# Patient Record
Sex: Female | Born: 1980 | Race: Black or African American | Hispanic: No | Marital: Married | Smoking: Never smoker
Health system: Southern US, Community
[De-identification: ages and names within clinical notes are randomized; demographics above are authoritative.]

## PROBLEM LIST (undated history)

## (undated) ENCOUNTER — Inpatient Hospital Stay (HOSPITAL_COMMUNITY): Payer: Self-pay

## (undated) HISTORY — PX: WISDOM TOOTH EXTRACTION: SHX21

---

## 2012-10-22 ENCOUNTER — Encounter (HOSPITAL_COMMUNITY): Payer: Self-pay | Admitting: Emergency Medicine

## 2012-10-22 ENCOUNTER — Emergency Department (INDEPENDENT_AMBULATORY_CARE_PROVIDER_SITE_OTHER)
Admission: EM | Admit: 2012-10-22 | Discharge: 2012-10-22 | Disposition: A | Discharge status: Home / Self Care | Payer: Self-pay | Source: Home / Self Care | Attending: Family Medicine | Admitting: Family Medicine

## 2012-10-22 DIAGNOSIS — Z3201 Encounter for pregnancy test, result positive: Secondary | ICD-10-CM

## 2012-10-22 LAB — POCT PREGNANCY, URINE: Preg Test, Ur: POSITIVE — AB

## 2012-10-22 MED ORDER — PRENATAL COMPLETE 14-0.4 MG PO TABS: ORAL_TABLET | ORAL | Status: DC

## 2012-10-22 NOTE — ED Provider Notes (Signed)
Medical screening examination/treatment/procedure(s) were performed by resident physician or non-physician practitioner and as supervising physician I was immediately available for consultation/collaboration.   Laurence Crofford DOUGLAS MD.   Chanelle Hodsdon D Joanann Mies, MD 10/22/12 1432 

## 2012-10-22 NOTE — ED Notes (Signed)
Pt requesting pregnancy testing. Pt voices no concerns.

## 2012-10-22 NOTE — ED Provider Notes (Signed)
CSN: [id number]     Arrival date & time 10/22/12  1045 History   First MD Initiated Contact with Patient 10/22/12 1137     Chief Complaint  Patient presents with  . Possible Pregnancy    request pregnancy test.    (Consider location/radiation/quality/duration/timing/severity/associated sxs/prior Treatment) HPI Comments: 32 year old female presents requesting a pregnancy test. She is late on her period. Last menstrual period began on August 9. She is usually very irregular. Additionally, she has been having breast soreness and some mild lower back pain which is unusual for her. She is in a steady relationship. The baby was not planned but they are not trying to not have a baby either. Denies abdominal pain, fever, chills, vaginal bleeding, vaginal discharge.  Patient is a 32 y.o. female presenting with pregnancy problem.  Possible Pregnancy Patient reports no abdominal pain.  Associated symptoms include no dysuria, no fever, no light-headedness, no nausea, no shortness of breath, no vomiting and no weakness.    History reviewed. No pertinent past medical history. History reviewed. No pertinent past surgical history. History reviewed. No pertinent family history. History  Substance Use Topics  . Smoking status: Never Smoker   . Smokeless tobacco: Not on file  . Alcohol Use: Yes   OB History   Grav Para Term Preterm Abortions TAB SAB Ect Mult Living                 Review of Systems  Constitutional: Negative for fever and chills.  Eyes: Negative for visual disturbance.  Respiratory: Negative for cough and shortness of breath.   Cardiovascular: Negative for chest pain, palpitations and leg swelling.  Gastrointestinal: Negative for nausea, vomiting and abdominal pain.  Endocrine: Negative for polydipsia and polyuria.  Genitourinary: Negative for dysuria, urgency and frequency.       Breast soreness  Musculoskeletal: Positive for back pain. Negative for myalgias and arthralgias.   Skin: Negative for rash.  Neurological: Negative for dizziness, weakness and light-headedness.    Allergies  Review of patient's allergies indicates no known allergies.  Home Medications   Current Outpatient Rx  Name  Route  Sig  Dispense  Refill  . Prenatal Vit-Fe Fumarate-FA (PRENATAL COMPLETE) 14-0.4 MG TABS      Use as directed on package   60 each   0    BP 138/93  Pulse 78  Resp 14  SpO2 100%  LMP 09/18/2012 Physical Exam  Nursing note and vitals reviewed. Constitutional: She is oriented to person, place, and time. She appears well-developed and well-nourished. No distress.  HENT:  Head: Normocephalic and atraumatic.  Eyes: Pupils are equal, round, and reactive to light.  Pulmonary/Chest: Effort normal. No respiratory distress.  Abdominal: Soft. She exhibits no mass. There is no tenderness. There is no rebound and no guarding.  Neurological: She is alert and oriented to person, place, and time. Coordination normal.  Skin: Skin is warm and dry. No rash noted. She is not diaphoretic.  Psychiatric: She has a normal mood and affect. Judgment normal.    ED Course  Procedures (including critical care time) Labs Review Labs Reviewed  POCT PREGNANCY, URINE - Abnormal; Notable for the following:    Preg Test, Ur POSITIVE (*)    All other components within normal limits  HCG, QUANTITATIVE, PREGNANCY   Imaging Review No results found.  MDM   1. Positive urine pregnancy test    Urine pregnancy test positive. Sending hCG Quant, [facility]  She should start taking a prenatal vitamin at this time as well.     Graylon Good, PA-C 10/22/12 1216

## 2012-10-23 NOTE — ED Notes (Signed)
BHCG: 2762 H.  Sent to [employer] [facility].

## 2013-06-25 ENCOUNTER — Inpatient Hospital Stay (HOSPITAL_COMMUNITY)
Admission: AD | Admit: 2013-06-25 | Discharge: 2013-06-25 | Disposition: A | Discharge status: Home / Self Care | Payer: BC Managed Care – PPO | Source: Ambulatory Visit | Attending: Obstetrics and Gynecology | Admitting: Obstetrics and Gynecology

## 2013-06-25 ENCOUNTER — Encounter (HOSPITAL_COMMUNITY): Payer: Self-pay

## 2013-06-25 ENCOUNTER — Encounter (HOSPITAL_COMMUNITY): Payer: Self-pay | Admitting: *Deleted

## 2013-06-25 ENCOUNTER — Inpatient Hospital Stay (HOSPITAL_COMMUNITY)
Admission: AD | Admit: 2013-06-25 | Discharge: 2013-06-27 | DRG: 776 | Disposition: A | Discharge status: Home / Self Care | Payer: BC Managed Care – PPO | Source: Ambulatory Visit | Attending: Obstetrics and Gynecology | Admitting: Obstetrics and Gynecology

## 2013-06-25 ENCOUNTER — Inpatient Hospital Stay (HOSPITAL_COMMUNITY): Payer: BC Managed Care – PPO

## 2013-06-25 DIAGNOSIS — O479 False labor, unspecified: Secondary | ICD-10-CM | POA: Insufficient documentation

## 2013-06-25 MED ORDER — ZOLPIDEM TARTRATE 5 MG PO TABS
5.0000 mg | ORAL_TABLET | Freq: Every evening | ORAL | Status: DC | PRN
Start: 2013-06-25 — End: 2013-06-27

## 2013-06-25 MED ORDER — ONDANSETRON HCL 4 MG PO TABS: 4.0000 mg | ORAL_TABLET | ORAL | Status: DC | PRN

## 2013-06-25 MED ORDER — METHYLERGONOVINE MALEATE 0.2 MG/ML IJ SOLN: 0.2000 mg | INTRAMUSCULAR | Status: DC | PRN

## 2013-06-25 MED ORDER — DIBUCAINE 1 % RE OINT: 1.0000 "application " | TOPICAL_OINTMENT | RECTAL | Status: DC | PRN

## 2013-06-25 MED ORDER — IBUPROFEN 600 MG PO TABS
600.0000 mg | ORAL_TABLET | Freq: Four times a day (QID) | ORAL | Status: DC
  Administered 2013-06-25 – 2013-06-27 (×7): 600 mg via ORAL
  Filled 2013-06-25 (×7): qty 1

## 2013-06-25 MED ORDER — SIMETHICONE 80 MG PO CHEW
80.0000 mg | CHEWABLE_TABLET | ORAL | Status: DC | PRN
Start: 2013-06-25 — End: 2013-06-27

## 2013-06-25 MED ORDER — TETANUS-DIPHTH-ACELL PERTUSSIS 5-2.5-18.5 LF-MCG/0.5 IM SUSP: 0.5000 mL | Freq: Once | INTRAMUSCULAR | Status: DC

## 2013-06-25 MED ORDER — WITCH HAZEL-GLYCERIN EX PADS: 1.0000 "application " | MEDICATED_PAD | CUTANEOUS | Status: DC | PRN

## 2013-06-25 MED ORDER — DIPHENHYDRAMINE HCL 25 MG PO CAPS: 25.0000 mg | ORAL_CAPSULE | Freq: Four times a day (QID) | ORAL | Status: DC | PRN

## 2013-06-25 MED ORDER — MAGNESIUM HYDROXIDE 400 MG/5ML PO SUSP: 30.0000 mL | ORAL | Status: DC | PRN

## 2013-06-25 MED ORDER — LANOLIN HYDROUS EX OINT: TOPICAL_OINTMENT | CUTANEOUS | Status: DC | PRN

## 2013-06-25 MED ORDER — METHYLERGONOVINE MALEATE 0.2 MG PO TABS: 0.2000 mg | ORAL_TABLET | ORAL | Status: DC | PRN

## 2013-06-25 MED ORDER — ONDANSETRON HCL 4 MG/2ML IJ SOLN: 4.0000 mg | INTRAMUSCULAR | Status: DC | PRN

## 2013-06-25 MED ORDER — PRENATAL MULTIVITAMIN CH
1.0000 | ORAL_TABLET | Freq: Every day | ORAL | Status: DC
  Administered 2013-06-26: 1 via ORAL
  Filled 2013-06-25: qty 1

## 2013-06-25 MED ORDER — OXYCODONE-ACETAMINOPHEN 5-325 MG PO TABS: 1.0000 | ORAL_TABLET | ORAL | Status: DC | PRN

## 2013-06-25 MED ORDER — ERYTHROMYCIN 5 MG/GM OP OINT
TOPICAL_OINTMENT | Freq: Once | OPHTHALMIC | Status: DC
  Filled 2013-06-25: qty 1

## 2013-06-25 MED ORDER — MEASLES, MUMPS & RUBELLA VAC ~~LOC~~ INJ
0.5000 mL | INJECTION | Freq: Once | SUBCUTANEOUS | Status: AC
  Administered 2013-06-27: 0.5 mL via SUBCUTANEOUS
  Filled 2013-06-25: qty 0.5

## 2013-06-25 MED ORDER — BENZOCAINE-MENTHOL 20-0.5 % EX AERO: 1.0000 "application " | INHALATION_SPRAY | CUTANEOUS | Status: DC | PRN

## 2013-06-25 MED ORDER — SENNOSIDES-DOCUSATE SODIUM 8.6-50 MG PO TABS
2.0000 | ORAL_TABLET | ORAL | Status: DC
  Administered 2013-06-25 – 2013-06-26 (×2): 2 via ORAL
  Filled 2013-06-25 (×2): qty 2

## 2013-06-25 NOTE — MAU Note (Addendum)
PT  SAYS SHE STARTED HURTING   BAD AT 10PM.   VE IN OFFICE  THIS WEEK- 1 CM.    DENIES HSV AND MRSA. GBS- NEG

## 2013-06-25 NOTE — MAU Note (Signed)
Called mother baby nurse to give report. Nurse preferred bedside report and said to bring pt in 15 minutes.

## 2013-06-25 NOTE — H&P (Signed)
Crystal Sharp is a 33 y.o. female, G2 P0010, EGA [redacted] weeks with EDC 5-16 presenting for regular ctx.  She was seen earlier this morning to r/o labor, was having ctx q 5-7 min, VE 1 cm and did not change.  FHT was not reactive initially, BPP 8/8 and then FHT reactive.  She was sent home around 0600.  She continued to have ctx, they gradually got closer and stronger but she did not want to come back and get sent home again.  She started having some bleeding and increasing pressure so she came in.  When she arrived in the parking lot, she was pushing the baby out.  Dad delivered the baby in the car, [facility] CNM delivered the placenta in [facility].  Mom and baby doing well.  Prenatal care essentially uncomplicated, see prenatal records for complete history.  Maternal Medical History:  Reason for admission: Contractions.   Contractions: Frequency: regular.   Perceived severity is strong.    Fetal activity: Perceived fetal activity is normal.    Prenatal complications: no prenatal complications   OB History   Grav Para Term Preterm Abortions TAB SAB Ect Mult Living   2    1 1          No past medical history on file. Past Surgical History  Procedure Laterality Date  . Wisdom tooth extraction     Family History: family history is not on file. Social History:  reports that she has never smoked. She does not have any smokeless tobacco history on file. She reports that she drinks alcohol. She reports that she does not use illicit drugs.   Prenatal Transfer Tool  Maternal Diabetes: No Genetic Screening: Normal Maternal Ultrasounds/Referrals: Normal Fetal Ultrasounds or other Referrals:  None Maternal Substance Abuse:  No Significant Maternal Medications:  None Significant Maternal Lab Results:  Lab values include: Group B Strep negative Other Comments:  Delivered in the car  Review of Systems  Respiratory: Negative.   Cardiovascular: Negative.       Blood pressure 141/77, pulse 97,  temperature 98.3 F (36.8 C), last menstrual period 09/18/2012. Exam Physical Exam  Constitutional: She appears well-developed and well-nourished.  Cardiovascular: Normal rate, regular rhythm and normal heart sounds.   No murmur heard. Respiratory: Effort normal and breath sounds normal. No respiratory distress. She has no wheezes.  GI: Soft.  Fundus firm at U-0  Genitourinary:  Perineum with left labial laceration-hemostatic and not repaired, also small right labial abrasion Bleeding appears normal    Prenatal labs: ABO, Rh:  O pos Antibody:  neg Rubella:  Immune RPR:   NR HBsAg:  Neg  HIV:  Neg  GBS:   Neg GCT:  79  Assessment/Plan: IUP at 40 weeks with precipitous delivery in the car on arrival to MAU.  CNM delivered placenta, I am unsure of EBL, but it does not appear to be excessive.  Routine postpartum care.   Abdulrahman Bracey 06/25/2013, 2:23 PM

## 2013-06-26 LAB — CBC
HCT: 30.2 % — ABNORMAL LOW (ref 36.0–46.0)
Hemoglobin: 9.9 g/dL — ABNORMAL LOW (ref 12.0–15.0)
MCH: 28.4 pg (ref 26.0–34.0)
MCHC: 32.8 g/dL (ref 30.0–36.0)
MCV: 86.5 fL (ref 78.0–100.0)
PLATELETS: 197 10*3/uL (ref 150–400)
RBC: 3.49 MIL/uL — AB (ref 3.87–5.11)
RDW: 13.8 % (ref 11.5–15.5)
WBC: 9.4 10*3/uL (ref 4.0–10.5)

## 2013-06-26 LAB — RPR

## 2013-06-26 LAB — ABO/RH: ABO/RH(D): O POS

## 2013-06-26 NOTE — Lactation Note (Signed)
This note was copied from the chart of Crystal Sharp. Lactation Consultation Note: Initial visit with mom. She reports that baby has been nursing well. A little pain at the beginning of the feeding but then eases off. Baby asleep in bassinet at present and mom eating lunch. BF brochure given with resources for support after DC. No questions at present. To call prn  Patient Name: Crystal Timmi Laural BenesJohnson Today's Date: 06/26/2013 Reason for consult: Initial assessment   Maternal Data Formula Feeding for Exclusion: No Does the patient have breastfeeding experience prior to this delivery?: No  Feeding    LATCH Score/Interventions                      Lactation Tools Discussed/Used WIC Program: Yes   Consult Status Consult Status: Follow-up Date: 06/27/13 Follow-up type: In-patient    Pamelia HoitDonna D Grant Swager 06/26/2013, 1:46 PM

## 2013-06-27 MED ORDER — IBUPROFEN 600 MG PO TABS: 600.0000 mg | ORAL_TABLET | Freq: Four times a day (QID) | ORAL | Status: DC

## 2013-06-27 NOTE — Lactation Note (Signed)
This note was copied from the chart of Crystal Pari Johnson. Lactation Consultation Note: Mom ready for DC Reports that baby just fed about 20 minutes ago. Reports that nipples are getting sore ,but intact. Comfort gels given with instructions for use and cleaning. No questions at present. Reviewed BFSG and OP appointments as resources for support after DC. To call prn  Patient Name: Crystal Sharp Today's Date: 06/27/2013 Reason for consult: Follow-up assessment   Maternal Data Formula Feeding for Exclusion: No  Feeding    LATCH Score/Interventions          Comfort (Breast/Nipple): Filling, red/small blisters or bruises, mild/mod discomfort  Problem noted: Mild/Moderate discomfort Interventions (Mild/moderate discomfort): Comfort gels        Lactation Tools Discussed/Used Tools: Comfort gels   Consult Status Consult Status: Complete    Pamelia HoitDonna D Ersa Delaney 06/27/2013, 12:12 PM

## 2013-06-27 NOTE — Discharge Instructions (Signed)
As per discharge pamphlet °

## 2013-06-27 NOTE — Progress Notes (Signed)
PPD #2 I saw her yesterday but neglected to write a note.  She was not having any problems. Today, doing well Afeb, VSS Fundus Firm at U-1 D/c home

## 2013-06-27 NOTE — Discharge Summary (Signed)
Obstetric Discharge Summary Reason for Admission: onset of labor Prenatal Procedures: none Intrapartum Procedures: spontaneous vaginal delivery Postpartum Procedures: none Complications-Operative and Postpartum: left labial laceration Hemoglobin  Date Value Ref Range Status  06/26/2013 9.9* 12.0 - 15.0 g/dL Final     HCT  Date Value Ref Range Status  06/26/2013 30.2* 36.0 - 46.0 % Final    Physical Exam:  General: alert Lochia: appropriate Uterine Fundus: firm  Discharge Diagnoses: Term Pregnancy-delivered-precipitous delivery in the parking lot  Discharge Information: Date: 06/27/2013 Activity: pelvic rest Diet: routine Medications: Ibuprofen Condition: stable Instructions: refer to practice specific booklet Discharge to: home Follow-up Information   Follow up with Ramey Schiff D, MD. Schedule an appointment as soon as possible for a visit in 6 weeks.   Specialty:  Obstetrics and Gynecology   Contact information:   [address], [address] [place] [address] [phone]       Newborn Data: Live born female  Birth Weight: 7 lb 10.8 oz (3480 g) APGAR: 9, 9  Home with mother.  Loy Mccartt 06/27/2013, 8:08 AM

## 2013-06-29 ENCOUNTER — Inpatient Hospital Stay (HOSPITAL_COMMUNITY): Admission: RE | Admit: 2013-06-29 | Payer: BC Managed Care – PPO | Source: Ambulatory Visit

## 2013-12-12 ENCOUNTER — Encounter (HOSPITAL_COMMUNITY): Payer: Self-pay

## 2014-08-30 ENCOUNTER — Inpatient Hospital Stay (HOSPITAL_COMMUNITY)
Admission: AD | Admit: 2014-08-30 | Discharge: 2014-08-31 | Disposition: A | Discharge status: Home / Self Care | Payer: BLUE CROSS/BLUE SHIELD | Source: Ambulatory Visit | Attending: Obstetrics & Gynecology | Admitting: Obstetrics & Gynecology

## 2014-08-30 ENCOUNTER — Inpatient Hospital Stay (HOSPITAL_COMMUNITY): Payer: BLUE CROSS/BLUE SHIELD

## 2014-08-30 ENCOUNTER — Encounter (HOSPITAL_COMMUNITY): Payer: Self-pay | Admitting: *Deleted

## 2014-08-30 DIAGNOSIS — O209 Hemorrhage in early pregnancy, unspecified: Secondary | ICD-10-CM | POA: Diagnosis present

## 2014-08-30 DIAGNOSIS — O200 Threatened abortion: Secondary | ICD-10-CM | POA: Insufficient documentation

## 2014-08-30 DIAGNOSIS — Z3A01 Less than 8 weeks gestation of pregnancy: Secondary | ICD-10-CM | POA: Diagnosis not present

## 2014-08-30 LAB — URINE MICROSCOPIC-ADD ON

## 2014-08-30 LAB — CBC WITH DIFFERENTIAL/PLATELET
Basophils Absolute: 0 10*3/uL (ref 0.0–0.1)
Basophils Relative: 0 % (ref 0–1)
Eosinophils Absolute: 0.1 10*3/uL (ref 0.0–0.7)
Eosinophils Relative: 1 % (ref 0–5)
HCT: 36.9 % (ref 36.0–46.0)
Hemoglobin: 12.4 g/dL (ref 12.0–15.0)
Lymphocytes Relative: 31 % (ref 12–46)
Lymphs Abs: 2.9 10*3/uL (ref 0.7–4.0)
MCH: 29.7 pg (ref 26.0–34.0)
MCHC: 33.6 g/dL (ref 30.0–36.0)
MCV: 88.5 fL (ref 78.0–100.0)
MONO ABS: 0.5 10*3/uL (ref 0.1–1.0)
MONOS PCT: 6 % (ref 3–12)
NEUTROS ABS: 5.8 10*3/uL (ref 1.7–7.7)
NEUTROS PCT: 62 % (ref 43–77)
PLATELETS: 265 10*3/uL (ref 150–400)
RBC: 4.17 MIL/uL (ref 3.87–5.11)
RDW: 12.7 % (ref 11.5–15.5)
WBC: 9.3 10*3/uL (ref 4.0–10.5)

## 2014-08-30 LAB — POCT PREGNANCY, URINE: Preg Test, Ur: POSITIVE — AB

## 2014-08-30 LAB — URINALYSIS, ROUTINE W REFLEX MICROSCOPIC
Bilirubin Urine: NEGATIVE
Glucose, UA: NEGATIVE mg/dL
Ketones, ur: NEGATIVE mg/dL
LEUKOCYTES UA: NEGATIVE
NITRITE: NEGATIVE
PROTEIN: NEGATIVE mg/dL
SPECIFIC GRAVITY, URINE: 1.02 (ref 1.005–1.030)
Urobilinogen, UA: 0.2 mg/dL (ref 0.0–1.0)
pH: 5.5 (ref 5.0–8.0)

## 2014-08-30 LAB — HCG, QUANTITATIVE, PREGNANCY: HCG, BETA CHAIN, QUANT, S: 14796 m[IU]/mL — AB (ref <5)

## 2014-08-30 NOTE — MAU Note (Signed)
Pt started bleeding heavy at 8pm. slight abd pain. nauseas, no vomiting.

## 2014-08-30 NOTE — MAU Provider Note (Signed)
History     CSN: [id number]  Arrival date and time: 08/30/14 2026   First Provider Initiated Contact with Patient 08/30/14 2234      No chief complaint on file.  HPI Crystal Sharp 34 y.o. [id number] with positive pregnancy test at home a week ago presents with vaginal bleeding x 1 day and mild abdominal pain.  The bleeding was similar to heavy period for a short time and was dark red blood with clots but no tissue noted.  Denies abdominal pain, dysuria, fever, weakness, SOB.  Was able to eat well earlier today.   OB History    Gravida Para Term Preterm AB TAB SAB Ectopic Multiple Living   History reviewed. No pertinent past medical history.  Past Surgical History  Procedure Laterality Date  . Wisdom tooth extraction      History reviewed. No pertinent family history.  History  Substance Use Topics  . Smoking status: Never Smoker   . Smokeless tobacco: Not on file  . Alcohol Use: Yes     Comment:  NONE WHILE PREG    Allergies: No Known Allergies  Prescriptions prior to admission  Medication Sig Dispense Refill Last Dose  . Prenatal Vit-Fe Fumarate-FA (PRENATAL COMPLETE) 14-0.4 MG TABS Use as directed on package (Patient taking differently: Take 2 tablets by mouth at bedtime. ) 60 each 0 08/29/2014 at Unknown time  . [DISCONTINUED] ibuprofen (ADVIL,MOTRIN) 600 MG tablet Take 1 tablet (600 mg total) by mouth every 6 (six) hours. (Patient not taking: Reported on 08/30/2014) 30 tablet 0 Not Taking    ROS Pertinent ROS in HPI.  All other systems are negative.   Physical Exam   Blood pressure 146/84, pulse 84, temperature 99 F (37.2 C), resp. rate 16, height  (1.753 m), weight 242 lb 9.6 oz (110.043 kg), last menstrual period 07/16/2014, unknown if currently breastfeeding.  Physical Exam  Constitutional: She is oriented to person, place, and time. She appears well-developed and well-nourished. No distress.  HENT:  Head: Atraumatic.  Eyes: EOM  are normal.  Neck: Normal range of motion.  Cardiovascular: Normal rate, regular rhythm and normal heart sounds.   Respiratory: Effort normal and breath sounds normal. No respiratory distress.  GI: Soft. Bowel sounds are normal. She exhibits no distension. There is no tenderness. There is no rebound and no guarding.  Genitourinary:  Mod amt of dark red blood / small clots noted in vault.  No active bleeding Cervix closed.  No tenderness  Musculoskeletal: Normal range of motion.  Neurological: She is alert and oriented to person, place, and time.  Skin: Skin is warm and dry.  Psychiatric: She has a normal mood and affect.    MAU Course  Procedures  MDM Ectopic workup ordered to eval as pt has vag bleeding in early pregnancy and anatomic location is yet to be proven Results for orders placed or performed during the hospital encounter of 08/30/14 (from the past 24 hour(s))  Urinalysis, Routine w reflex microscopic (not at [facility])     Status: Abnormal   Collection Time: 08/30/14  8:50 PM  Result Value Ref Range   Color, Urine RED (A) YELLOW   APPearance CLOUDY (A) CLEAR   Specific Gravity, Urine 1.020 1.005 - 1.030   pH 5.5 5.0 - 8.0   Glucose, UA NEGATIVE NEGATIVE mg/dL   Hgb urine dipstick LARGE (A) NEGATIVE   Bilirubin Urine NEGATIVE NEGATIVE  Ketones, ur NEGATIVE NEGATIVE mg/dL   Protein, ur NEGATIVE NEGATIVE mg/dL   Urobilinogen, UA 0.2 0.0 - 1.0 mg/dL   Nitrite NEGATIVE NEGATIVE   Leukocytes, UA NEGATIVE NEGATIVE  Urine microscopic-add on     Status: None   Collection Time: 08/30/14  8:50 PM  Result Value Ref Range   Squamous Epithelial / LPF RARE RARE   WBC, UA 0-2 <3 WBC/hpf   RBC / HPF TOO NUMEROUS TO COUNT <3 RBC/hpf   Bacteria, UA RARE RARE  Pregnancy, urine POC     Status: Abnormal   Collection Time: 08/30/14  9:57 PM  Result Value Ref Range   Preg Test, Ur POSITIVE (A) NEGATIVE  hCG, quantitative, pregnancy     Status: Abnormal   Collection Time: 08/30/14 10:49  PM  Result Value Ref Range   hCG, Beta Chain, Quant, S 14796 (H) <5 mIU/mL  CBC with Differential/Platelet     Status: None   Collection Time: 08/30/14 10:49 PM  Result Value Ref Range   WBC 9.3 4.0 - 10.5 K/uL   RBC 4.17 3.87 - 5.11 MIL/uL   Hemoglobin 12.4 12.0 - 15.0 g/dL   HCT 65.7 84.6 - 96.2 %   MCV 88.5 78.0 - 100.0 fL   MCH 29.7 26.0 - 34.0 pg   MCHC 33.6 30.0 - 36.0 g/dL   RDW 95.2 84.1 - 32.4 %   Platelets 265 150 - 400 K/uL   Neutrophils Relative % 62 43 - 77 %   Neutro Abs 5.8 1.7 - 7.7 K/uL   Lymphocytes Relative 31 12 - 46 %   Lymphs Abs 2.9 0.7 - 4.0 K/uL   Monocytes Relative 6 3 - 12 %   Monocytes Absolute 0.5 0.1 - 1.0 K/uL   Eosinophils Relative 1 0 - 5 %   Eosinophils Absolute 0.1 0.0 - 0.7 K/uL   Basophils Relative 0 0 - 1 %   Basophils Absolute 0.0 0.0 - 0.1 K/uL   Fax: [phone]   US Ob Comp Less 14 Wks  08/30/2014   CLINICAL DATA:  Acute onset of vaginal bleeding.  Initial encounter.  EXAM: OBSTETRIC <14 WK [place] AND TRANSVAGINAL OB US  TECHNIQUE: Both transabdominal and transvaginal ultrasound examinations were performed for complete evaluation of the gestation as well as the maternal uterus, adnexal regions, and pelvic cul-de-sac. Transvaginal technique was performed to assess early pregnancy.  COMPARISON:  Pelvic ultrasound performed 06/25/2013  FINDINGS: Intrauterine gestational sac: Visualized/normal in shape.  Yolk sac:  Yes  Embryo:  Yes  Cardiac Activity: Yes  Heart Rate: 82  bpm  CRL:  2.6  mm   5 w   6 d                  [place] EDC: 04/26/2015  Maternal uterus/adnexae: No subchorionic hemorrhage is noted. The uterus is otherwise unremarkable.  The ovaries are within normal limits. The right ovary measures 3.0 x 1.2 x 1.9 cm, while the left ovary measures 3.4 x 1.6 x 2.2 cm. No suspicious adnexal masses are seen; there is no evidence for ovarian torsion.  No free fluid is seen within the pelvic cul-de-sac.  IMPRESSION: Single live intrauterine pregnancy noted,  with a crown-rump length of 3 mm, corresponding to a gestational age of [redacted] weeks 6 days. The heart rate remains within normal limits for the size of the embryo. This matches the gestational age of [redacted] weeks 3 days by LMP, reflecting an estimated date of delivery of April 22, 2015.   Electronically Signed   By: Roanna RaiderJeffery  Chang M.D.   On: 08/30/2014 23:24   [place] Ob Transvaginal  08/30/2014   CLINICAL DATA:  Acute onset of vaginal bleeding.  Initial encounter.  EXAM: OBSTETRIC <14 WK US AND TRANSVAGINAL OB US  TECHNIQUE: Both transabdominal and transvaginal ultrasound examinations were performed for complete evaluation of the gestation as well as the maternal uterus, adnexal regions, and pelvic cul-de-sac. Transvaginal technique was performed to assess early pregnancy.  COMPARISON:  Pelvic ultrasound performed 06/25/2013  FINDINGS: Intrauterine gestational sac: Visualized/normal in shape.  Yolk sac:  Yes  Embryo:  Yes  Cardiac Activity: Yes  Heart Rate: 82  bpm  CRL:  2.6  mm   5 w   6 d                  US EDC: 04/26/2015  Maternal uterus/adnexae: No subchorionic hemorrhage is noted. The uterus is otherwise unremarkable.  The ovaries are within normal limits. The right ovary measures 3.0 x 1.2 x 1.9 cm, while the left ovary measures 3.4 x 1.6 x 2.2 cm. No suspicious adnexal masses are seen; there is no evidence for ovarian torsion.  No free fluid is seen within the pelvic cul-de-sac.  IMPRESSION: Single live intrauterine pregnancy noted, with a crown-rump length of 3 mm, corresponding to a gestational age of [redacted] weeks 6 days. The heart rate remains within normal limits for the size of the embryo. This matches the gestational age of [redacted] weeks 3 days by LMP, reflecting an estimated date of delivery of April 22, 2015.   Electronically Signed   By: Roanna RaiderJeffery  Chang M.D.   On: 08/30/2014 23:24   IUP confirmed on U/S.  Hemodynamically stable.  No evidence of infection  Assessment and Plan  A:  1. Threatened miscarriage in  early pregnancy   2. Vaginal bleeding in pregnancy, first trimester    P: Discharge to home Pelvic rest [facility] Patient may return to [facility] as needed or if her condition were to change or worsen   Bertram Denvereague Clark, Karen E 08/30/2014, 10:34 PM

## 2014-08-31 DIAGNOSIS — O200 Threatened abortion: Secondary | ICD-10-CM

## 2014-08-31 NOTE — Discharge Instructions (Signed)
Vaginal Bleeding During Pregnancy, First Trimester °A small amount of bleeding (spotting) from the vagina is relatively common in early pregnancy. It usually stops on its own. Various things may cause bleeding or spotting in early pregnancy. Some bleeding may be related to the pregnancy, and some may not. In most cases, the bleeding is normal and is not a problem. However, bleeding can also be a sign of something serious. Be sure to tell your health care provider about any vaginal bleeding right away. °Some possible causes of vaginal bleeding during the first trimester include: °· Infection or inflammation of the cervix. °· Growths (polyps) on the cervix. °· Miscarriage or threatened miscarriage. °· Pregnancy tissue has developed outside of the uterus and in a fallopian tube (tubal pregnancy). °· Tiny cysts have developed in the uterus instead of pregnancy tissue (molar pregnancy). °HOME CARE INSTRUCTIONS  °Watch your condition for any changes. The following actions may help to lessen any discomfort you are feeling: °· Follow your health care provider's instructions for limiting your activity. If your health care provider orders bed rest, you may need to stay in bed and only get up to use the bathroom. However, your health care provider may allow you to continue light activity. °· If needed, make plans for someone to help with your regular activities and responsibilities while you are on bed rest. °· Keep track of the number of pads you use each day, how often you change pads, and how soaked (saturated) they are. Write this down. °· Do not use tampons. Do not douche. °· Do not have sexual intercourse or orgasms until approved by your health care provider. °· If you pass any tissue from your vagina, save the tissue so you can show it to your health care provider. °· Only take over-the-counter or prescription medicines as directed by your health care provider. °· Do not take aspirin because it can make you  bleed. °· Keep all follow-up appointments as directed by your health care provider. °SEEK MEDICAL CARE IF: °· You have any vaginal bleeding during any part of your pregnancy. °· You have cramps or labor pains. °· You have a fever, not controlled by medicine. °SEEK IMMEDIATE MEDICAL CARE IF:  °· You have severe cramps in your back or belly (abdomen). °· You pass large clots or tissue from your vagina. °· Your bleeding increases. °· You feel light-headed or weak, or you have fainting episodes. °· You have chills. °· You are leaking fluid or have a gush of fluid from your vagina. °· You pass out while having a bowel movement. °MAKE SURE YOU: °· Understand these instructions. °· Will watch your condition. °· Will get help right away if you are not doing well or get worse. °Document Released: 11/06/2004 Document Revised: 02/01/2013 Document Reviewed: 10/04/2012 °ExitCare® Patient Information ©2015 [employer]. This information is not intended to replace advice given to you by your health care provider. Make sure you discuss any questions you have with your health care provider. ° °Pelvic Rest °Pelvic rest is sometimes recommended for women when:  °· The placenta is partially or completely covering the opening of the cervix (placenta previa). °· There is bleeding between the uterine wall and the amniotic sac in the first trimester (subchorionic hemorrhage). °· The cervix begins to open without labor starting (incompetent cervix, cervical insufficiency). °· The labor is too early (preterm labor). °HOME CARE INSTRUCTIONS °· Do not have sexual intercourse, stimulation, or an orgasm. °· Do not use tampons, douche, or   put anything in the vagina.  Do not lift anything over 10 pounds (4.5 kg).  Avoid strenuous activity or straining your pelvic muscles. SEEK MEDICAL CARE IF:  You have any vaginal bleeding during pregnancy. Treat this as a potential emergency.  You have cramping pain felt low in the stomach (stronger  than menstrual cramps).  You notice vaginal discharge (watery, mucus, or bloody).  You have a low, dull backache.  There are regular contractions or uterine tightening. SEEK IMMEDIATE MEDICAL CARE IF: You have vaginal bleeding and have placenta previa.  Document Released: 05/24/2010 Document Revised: 04/21/2011 Document Reviewed: 05/24/2010 [facility] Patient Information 2015 [place], [place]. This information is not intended to replace advice given to you by your health care provider. Make sure you discuss any questions you have with your health care provider.  Threatened Miscarriage A threatened miscarriage is when you have vaginal bleeding during your first 20 weeks of pregnancy but the pregnancy has not ended. Your doctor will do tests to make sure you are still pregnant. The cause of the bleeding may not be known. This condition does not mean your pregnancy will end. It does increase the risk of it ending (complete miscarriage). HOME CARE   Make sure you keep all your doctor visits for prenatal care.  Get plenty of rest.  Do not have sex or use tampons if you have vaginal bleeding.  Do not douche.  Do not smoke or use drugs.  Do not drink alcohol.  Avoid caffeine. GET HELP IF:  You have light bleeding from your vagina.  You have belly pain or cramping.  You have a fever. GET HELP RIGHT AWAY IF:   You have heavy bleeding from your vagina.  You have clots of blood coming from your vagina.  You have bad pain or cramps in your low back or belly.  You have fever, chills, and bad belly pain. MAKE SURE YOU:   Understand these instructions.  Will watch your condition.  Will get help right away if you are not doing well or get worse. Document Released: 01/10/2008 Document Revised: 02/01/2013 Document Reviewed: 11/23/2012 [facility] Patient Information 2015 [place], [place]. This information is not intended to replace advice given to you by your health care provider. Make  sure you discuss any questions you have with your health care provider.

## 2014-09-26 LAB — OB RESULTS CONSOLE HEPATITIS B SURFACE ANTIGEN: Hepatitis B Surface Ag: NEGATIVE

## 2014-09-26 LAB — OB RESULTS CONSOLE ANTIBODY SCREEN: Antibody Screen: NEGATIVE

## 2014-09-26 LAB — OB RESULTS CONSOLE GC/CHLAMYDIA
CHLAMYDIA, DNA PROBE: NEGATIVE
GC PROBE AMP, GENITAL: NEGATIVE

## 2014-09-26 LAB — OB RESULTS CONSOLE HIV ANTIBODY (ROUTINE TESTING): HIV: NONREACTIVE

## 2014-09-26 LAB — OB RESULTS CONSOLE ABO/RH: RH Type: POSITIVE

## 2014-09-26 LAB — OB RESULTS CONSOLE RPR: RPR: NONREACTIVE

## 2014-09-26 LAB — OB RESULTS CONSOLE RUBELLA ANTIBODY, IGM: RUBELLA: IMMUNE

## 2015-02-11 NOTE — L&D Delivery Note (Signed)
Delivery Note Pt progressed rapidly to complete dilation and pushed welll.  Baby had some variable decels at delivery, but recovered well.  At 2:41 PM a healthy female was delivered via Vaginal, Spontaneous Delivery (Presentation: Left Occiput Anterior).  APGAR: 8, 9; weight pending .   Placenta status: Intact, Spontaneous.  Cord:  with the following complications: None.  Moderate meconium  Anesthesia: None  Episiotomy: None Lacerations: None Suture Repair:n/a Est. Blood Loss (mL): 226ml  Mom to postpartum.  Baby to Couplet care / Skin to Skin. Pt with deep productive cough.  Will place on Z-pack postpartum.  Oliver PilaRICHARDSON,Matthews Franks W 04/28/2015, 2:56 PM

## 2015-03-27 LAB — OB RESULTS CONSOLE GBS: STREP GROUP B AG: NEGATIVE

## 2015-04-28 ENCOUNTER — Encounter (HOSPITAL_COMMUNITY): Payer: Self-pay | Admitting: *Deleted

## 2015-04-28 ENCOUNTER — Inpatient Hospital Stay (HOSPITAL_COMMUNITY)
Admission: AD | Admit: 2015-04-28 | Discharge: 2015-04-30 | DRG: 775 | Disposition: A | Discharge status: Home / Self Care | Payer: BLUE CROSS/BLUE SHIELD | Source: Ambulatory Visit | Attending: Obstetrics and Gynecology | Admitting: Obstetrics and Gynecology

## 2015-04-28 DIAGNOSIS — O134 Gestational [pregnancy-induced] hypertension without significant proteinuria, complicating childbirth: Principal | ICD-10-CM | POA: Diagnosis present

## 2015-04-28 DIAGNOSIS — J069 Acute upper respiratory infection, unspecified: Secondary | ICD-10-CM | POA: Diagnosis present

## 2015-04-28 DIAGNOSIS — Z3A40 40 weeks gestation of pregnancy: Secondary | ICD-10-CM | POA: Diagnosis not present

## 2015-04-28 DIAGNOSIS — Z8249 Family history of ischemic heart disease and other diseases of the circulatory system: Secondary | ICD-10-CM

## 2015-04-28 LAB — URINALYSIS, ROUTINE W REFLEX MICROSCOPIC
Bilirubin Urine: NEGATIVE
Glucose, UA: NEGATIVE mg/dL
KETONES UR: 15 mg/dL — AB
NITRITE: NEGATIVE
PH: 6 (ref 5.0–8.0)
Protein, ur: NEGATIVE mg/dL
Specific Gravity, Urine: 1.025 (ref 1.005–1.030)

## 2015-04-28 LAB — URINE MICROSCOPIC-ADD ON

## 2015-04-28 LAB — CBC
HCT: 32.6 % — ABNORMAL LOW (ref 36.0–46.0)
HEMOGLOBIN: 10.6 g/dL — AB (ref 12.0–15.0)
MCH: 27.6 pg (ref 26.0–34.0)
MCHC: 32.5 g/dL (ref 30.0–36.0)
MCV: 84.9 fL (ref 78.0–100.0)
PLATELETS: 252 10*3/uL (ref 150–400)
RBC: 3.84 MIL/uL — AB (ref 3.87–5.11)
RDW: 15.6 % — ABNORMAL HIGH (ref 11.5–15.5)
WBC: 11.8 10*3/uL — AB (ref 4.0–10.5)

## 2015-04-28 LAB — TYPE AND SCREEN
ABO/RH(D): O POS
Antibody Screen: NEGATIVE

## 2015-04-28 LAB — RPR: RPR: NONREACTIVE

## 2015-04-28 MED ORDER — LACTATED RINGERS IV SOLN
INTRAVENOUS | Status: DC
  Administered 2015-04-28 (×2): via INTRAVENOUS

## 2015-04-28 MED ORDER — SENNOSIDES-DOCUSATE SODIUM 8.6-50 MG PO TABS
2.0000 | ORAL_TABLET | ORAL | Status: DC
  Administered 2015-04-28 – 2015-04-29 (×2): 2 via ORAL
  Filled 2015-04-28 (×2): qty 2

## 2015-04-28 MED ORDER — LIDOCAINE HCL (PF) 1 % IJ SOLN
30.0000 mL | INTRAMUSCULAR | Status: DC | PRN
  Filled 2015-04-28: qty 30

## 2015-04-28 MED ORDER — ACETAMINOPHEN 325 MG PO TABS: 650.0000 mg | ORAL_TABLET | ORAL | Status: DC | PRN

## 2015-04-28 MED ORDER — TERBUTALINE SULFATE 1 MG/ML IJ SOLN
0.2500 mg | Freq: Once | INTRAMUSCULAR | Status: DC | PRN
  Filled 2015-04-28: qty 1

## 2015-04-28 MED ORDER — OXYTOCIN BOLUS FROM INFUSION: 500.0000 mL | INTRAVENOUS | Status: DC

## 2015-04-28 MED ORDER — AZITHROMYCIN 250 MG PO TABS
500.0000 mg | ORAL_TABLET | Freq: Every day | ORAL | Status: AC
  Administered 2015-04-28: 500 mg via ORAL
  Filled 2015-04-28: qty 2

## 2015-04-28 MED ORDER — GUAIFENESIN-DM 100-10 MG/5ML PO SYRP
5.0000 mL | ORAL_SOLUTION | ORAL | Status: DC | PRN
  Administered 2015-04-29: 5 mL via ORAL
  Filled 2015-04-28 (×2): qty 5

## 2015-04-28 MED ORDER — TETANUS-DIPHTH-ACELL PERTUSSIS 5-2.5-18.5 LF-MCG/0.5 IM SUSP: 0.5000 mL | Freq: Once | INTRAMUSCULAR | Status: DC

## 2015-04-28 MED ORDER — ZOLPIDEM TARTRATE 5 MG PO TABS: 5.0000 mg | ORAL_TABLET | Freq: Every evening | ORAL | Status: DC | PRN

## 2015-04-28 MED ORDER — LANOLIN HYDROUS EX OINT: TOPICAL_OINTMENT | CUTANEOUS | Status: DC | PRN

## 2015-04-28 MED ORDER — WITCH HAZEL-GLYCERIN EX PADS: 1.0000 "application " | MEDICATED_PAD | CUTANEOUS | Status: DC | PRN

## 2015-04-28 MED ORDER — CITRIC ACID-SODIUM CITRATE 334-500 MG/5ML PO SOLN
30.0000 mL | ORAL | Status: DC | PRN
  Filled 2015-04-28: qty 15

## 2015-04-28 MED ORDER — PRENATAL MULTIVITAMIN CH
1.0000 | ORAL_TABLET | Freq: Every day | ORAL | Status: DC
  Administered 2015-04-29: 1 via ORAL
  Filled 2015-04-28: qty 1

## 2015-04-28 MED ORDER — BUTORPHANOL TARTRATE 1 MG/ML IJ SOLN: 1.0000 mg | INTRAMUSCULAR | Status: DC | PRN

## 2015-04-28 MED ORDER — LACTATED RINGERS IV SOLN: 500.0000 mL | INTRAVENOUS | Status: DC | PRN

## 2015-04-28 MED ORDER — FLEET ENEMA 7-19 GM/118ML RE ENEM: 1.0000 | ENEMA | RECTAL | Status: DC | PRN

## 2015-04-28 MED ORDER — OXYTOCIN 10 UNIT/ML IJ SOLN
2.5000 [IU]/h | INTRAVENOUS | Status: DC
  Filled 2015-04-28: qty 10

## 2015-04-28 MED ORDER — IBUPROFEN 600 MG PO TABS
600.0000 mg | ORAL_TABLET | Freq: Four times a day (QID) | ORAL | Status: DC
  Administered 2015-04-28 – 2015-04-30 (×7): 600 mg via ORAL
  Filled 2015-04-28 (×7): qty 1

## 2015-04-28 MED ORDER — ONDANSETRON HCL 4 MG/2ML IJ SOLN: 4.0000 mg | INTRAMUSCULAR | Status: DC | PRN

## 2015-04-28 MED ORDER — OXYCODONE-ACETAMINOPHEN 5-325 MG PO TABS: 1.0000 | ORAL_TABLET | ORAL | Status: DC | PRN

## 2015-04-28 MED ORDER — AZITHROMYCIN 250 MG PO TABS
250.0000 mg | ORAL_TABLET | Freq: Every day | ORAL | Status: DC
  Administered 2015-04-29: 250 mg via ORAL
  Filled 2015-04-28 (×2): qty 1

## 2015-04-28 MED ORDER — DIBUCAINE 1 % RE OINT: 1.0000 "application " | TOPICAL_OINTMENT | RECTAL | Status: DC | PRN

## 2015-04-28 MED ORDER — ONDANSETRON HCL 4 MG/2ML IJ SOLN: 4.0000 mg | Freq: Four times a day (QID) | INTRAMUSCULAR | Status: DC | PRN

## 2015-04-28 MED ORDER — OXYCODONE HCL 5 MG PO TABS: 5.0000 mg | ORAL_TABLET | ORAL | Status: DC | PRN

## 2015-04-28 MED ORDER — OXYCODONE-ACETAMINOPHEN 5-325 MG PO TABS: 2.0000 | ORAL_TABLET | ORAL | Status: DC | PRN

## 2015-04-28 MED ORDER — OXYCODONE HCL 5 MG PO TABS: 10.0000 mg | ORAL_TABLET | ORAL | Status: DC | PRN

## 2015-04-28 MED ORDER — DIPHENHYDRAMINE HCL 25 MG PO CAPS: 25.0000 mg | ORAL_CAPSULE | Freq: Four times a day (QID) | ORAL | Status: DC | PRN

## 2015-04-28 MED ORDER — SIMETHICONE 80 MG PO CHEW: 80.0000 mg | CHEWABLE_TABLET | ORAL | Status: DC | PRN

## 2015-04-28 MED ORDER — ONDANSETRON HCL 4 MG PO TABS: 4.0000 mg | ORAL_TABLET | ORAL | Status: DC | PRN

## 2015-04-28 MED ORDER — OXYTOCIN 10 UNIT/ML IJ SOLN
1.0000 m[IU]/min | INTRAMUSCULAR | Status: DC
  Administered 2015-04-28: 2 m[IU]/min via INTRAVENOUS

## 2015-04-28 MED ORDER — BENZOCAINE-MENTHOL 20-0.5 % EX AERO: 1.0000 "application " | INHALATION_SPRAY | CUTANEOUS | Status: DC | PRN

## 2015-04-28 NOTE — Progress Notes (Signed)
Patient ID: Crystal Sharp, female   DOB: 06/22/1980, 35 y.o.   MRN: [id number] Pt has continued to contract, but somewhat spaced out and not really increasing FHR category 1 Cervix 70/4/-2  AROM clear  D/w pt early labor and augmenting with pitocin, she is agreeable Plans natural childbirth

## 2015-04-28 NOTE — Lactation Note (Signed)
This note was copied from a baby's chart. Lactation Consultation Note Initial visit at 8 hours of age.  Mom reports several good feedings and denies pain with latch.  Mom breast fed her older child for 1 year.  Baby is showing feeding cues.  LC assisted with positioning for football hold.  Baby latched well.  Encouraged mom to hold baby close to maintain deep latch.  Mom stimulating baby during feeding to keep baby active.  [facility] resources given and discussed.  Encouraged to feed with early cues on demand.  Early newborn behavior discussed.  Hand expression demonstrated with colostrum visible.  Mom to call for assist as needed.     Patient Name: Crystal Sharp Today's Date: 04/28/2015 Reason for consult: Initial assessment   Maternal Data Has patient been taught Hand Expression?: Yes Does the patient have breastfeeding experience prior to this delivery?: Yes  Feeding Feeding Type: Breast Fed Length of feed:  (few minutes observed)  LATCH Score/Interventions Latch: Grasps breast easily, tongue down, lips flanged, rhythmical sucking.  Audible Swallowing: A few with stimulation  Type of Nipple: Everted at rest and after stimulation  Comfort (Breast/Nipple): Soft / non-tender     Hold (Positioning): Assistance needed to correctly position infant at breast and maintain latch. Intervention(s): Breastfeeding basics reviewed;Support Pillows;Position options;Skin to skin  LATCH Score: 8  Lactation Tools Discussed/Used     Consult Status Consult Status: Follow-up Date: 04/29/15 Follow-up type: In-patient    Jannifer RodneyShoptaw, Maxtyn Nuzum Lynn 04/28/2015, 10:52 PM

## 2015-04-28 NOTE — Progress Notes (Signed)
Patient ID: Crystal Sharp, female   DOB: 01/11/1981, 35 y.o.   MRN: [id number] Pt is increasingly uncomfortable  afeb VSS FHR with good variability variable decels with contractions One prolonged decel and then resolved with position change and O2 Pt placed in hand/knees Pitocin was turned off, will resume as tracing allows and follow progress, feels OP Cervix 80/5/-1

## 2015-04-28 NOTE — H&P (Signed)
Crystal Sharp is a 35 y.o. female G2P011 at 8240 0/7 weeks (EDD 04/28/15 by 6 wek US )   presenting for regular moderate contractions and cervical change to 3-4 cm.  Preanatal care complicated by a history of precipitous delivery last pregnancy on way to hospital.  She has possible borderline CHTN with her BP this pregnancy intermittently elevated to 150/90 range, but not consistently.  No preeclamptic s/s.  Maternal Medical History:  Reason for admission: Contractions.   Contractions: Onset was 3-5 hours ago.   Frequency: regular.   Perceived severity is moderate.    Fetal activity: Perceived fetal activity is normal.    Prenatal complications: PIH.   Prenatal Complications - Diabetes: none.    OB History    Gravida Para Term Preterm AB TAB SAB Ectopic Multiple Living   3 1 1  1 1    1     2015 precip NSVD   History reviewed. No pertinent past medical history. Past Surgical History  Procedure Laterality Date  . Wisdom tooth extraction     Family History: family history includes Hypertension in her father. Social History:  reports that she has never smoked. She does not have any smokeless tobacco history on file. She reports that she drinks alcohol. She reports that she does not use illicit drugs.   Prenatal Transfer Tool  Maternal Diabetes: No Genetic Screening: Normal Maternal Ultrasounds/Referrals: Normal Fetal Ultrasounds or other Referrals:  None Maternal Substance Abuse:  No Significant Maternal Medications:  None Significant Maternal Lab Results:  None Other Comments:  None  Review of Systems  Neurological: Negative for headaches.    Dilation: 3.5 Effacement (%): 80 Station: -2 Exam by:: Avery DennisonBenji Stanley RN Blood pressure 124/87, pulse 105, temperature 98.4 F (36.9 C), temperature source Oral, resp. rate 16, height 5\' 9"  (1.753 m), weight 122.653 kg (270 lb 6.4 oz), last menstrual period 07/16/2014, unknown if currently breastfeeding. Maternal Exam:  Uterine  Assessment: Contraction strength is moderate.  Contraction frequency is regular.   Abdomen: Patient reports no abdominal tenderness. Fetal presentation: vertex  Introitus: Normal vulva. Normal vagina.    Physical Exam  Constitutional: She appears well-developed.  Cardiovascular: Normal rate and regular rhythm.   Respiratory: Effort normal.  GI: Soft.  Genitourinary: Vagina normal.  Neurological: She is alert.  Psychiatric: She has a normal mood and affect.    Prenatal labs: ABO, Rh: O/Positive/-- (08/16 0000) Antibody: Negative (08/16 0000) Rubella: Immune (08/16 0000) RPR: Nonreactive (08/16 0000)  HBsAg: Negative (08/16 0000)  HIV: Non-reactive (08/16 0000)  GBS: Negative (02/14 0000)  First trimester screen WNL One hour GCT 94  Assessment/Plan: Pt doing presented in early labor post due date. With h/o precipitous labor on way to hospital I advised her to stay and be augmented if needed and she is agreeable.  Will follow progress and if no cervical change consider AROM and low dose pitocin.   Oliver PilaRICHARDSON,Roxas Clymer W 04/28/2015, 7:45 AM

## 2015-04-28 NOTE — MAU Note (Signed)
Having contractions all day. Stronger since 2100. Denies LOF. Some brown on tissue when wipes

## 2015-04-29 LAB — CBC
HCT: 28.2 % — ABNORMAL LOW (ref 36.0–46.0)
HEMOGLOBIN: 9.1 g/dL — AB (ref 12.0–15.0)
MCH: 27.3 pg (ref 26.0–34.0)
MCHC: 32.3 g/dL (ref 30.0–36.0)
MCV: 84.7 fL (ref 78.0–100.0)
Platelets: 224 10*3/uL (ref 150–400)
RBC: 3.33 MIL/uL — AB (ref 3.87–5.11)
RDW: 15.5 % (ref 11.5–15.5)
WBC: 13.3 10*3/uL — ABNORMAL HIGH (ref 4.0–10.5)

## 2015-04-29 NOTE — Discharge Summary (Signed)
OB Discharge Summary     Patient Name: Crystal Sharp DOB: 1980-06-26 MRN: [id number]  Date of admission: 04/28/2015 Delivering MD: Huel Cote   Date of discharge: 04/30/2015  Admitting diagnosis: 40wks, Labor Intrauterine pregnancy: 101w0d     Secondary diagnosis:  Active Problems:   Normal labor   NSVD (normal spontaneous vaginal delivery)  Additional problems: URI     Discharge diagnosis: Term Pregnancy Delivered                                                                                                Post partum procedures:none   Augmentation: AROM and Pitocin  Complications: None  Hospital course:  Onset of Labor With Vaginal Delivery     35 y.o. yo [id number] at [redacted]w[redacted]d was admitted in Latent Labor on 04/28/2015. Patient had an uncomplicated labor course as follows:  Membrane Rupture Time/Date: 9:56 AM ,04/28/2015   Intrapartum Procedures: Episiotomy: None [1]                                         Lacerations:  None [1]  Patient had a delivery of a Viable infant. 04/28/2015  Information for the patient's newborn:  Laural Benes Girl Rewa [[id number]]  Delivery Method: Vag-Spont     Pateint had an uncomplicated postpartum course.  She is ambulating, tolerating a regular diet, passing flatus, and urinating well. Patient is discharged home in stable condition on 04/30/2015.    Physical exam  Filed Vitals:   04/28/15 1815 04/28/15 2207 04/29/15 0720 04/29/15 1800  BP: 128/59 112/55 113/66 132/62  Pulse: 99 83 94 88  Temp: 99.1 F (37.3 C) 98.4 F (36.9 C) 97.5 F (36.4 C) 98.6 F (37 C)  TempSrc: Oral Oral Oral   Resp: Height:      Weight:      SpO2:    99%   General: alert and cooperative Lochia: appropriate Uterine Fundus: firm  Labs: Lab Results  Component Value Date   WBC 13.3* 04/29/2015   HGB 9.1* 04/29/2015   HCT 28.2* 04/29/2015   MCV 84.7 04/29/2015   PLT 224 04/29/2015   No flowsheet data found.  Discharge instruction:  per After Visit Summary and "Baby and Me Booklet".  After visit meds:    Medication List    TAKE these medications        azithromycin 250 MG tablet  Commonly known as:  ZITHROMAX  Take one tablet daily to finish prescription     guaiFENesin-dextromethorphan 100-10 MG/5ML syrup  Commonly known as:  ROBITUSSIN DM  Take 5 mLs by mouth every 4 (four) hours as needed for cough.     ibuprofen 600 MG tablet  Commonly known as:  ADVIL,MOTRIN  Take 1 tablet (600 mg total) by mouth every 6 (six) hours.     PRENATAL COMPLETE 14-0.4 MG Tabs  Use as directed on package        Diet: routine diet  Activity: Advance as tolerated. Pelvic rest  for 6 weeks.   Outpatient follow up:6 weeks Follow up Appt:No future appointments. Follow up Visit:No Follow-up on file.  Postpartum contraception: Partner plans vasectomy  Newborn Data: Live born female  Birth Weight: 7 lb 9.7 oz (3450 g) APGAR: 8, 9  Baby Feeding: Breast Disposition:home with mother   04/30/2015 Oliver PilaICHARDSON,Demetrion Wesby W, MD

## 2015-04-29 NOTE — Progress Notes (Signed)
Post Partum Day 1 Subjective: no complaints and tolerating PO Cough improved, but would like decongestant Objective: Blood pressure 113/66, pulse 94, temperature 97.5 F (36.4 C), temperature source Oral, resp. rate 16, height 5\' 9"  (1.753 m), weight 122.653 kg (270 lb 6.4 oz), last menstrual period 07/16/2014, SpO2 100 %, unknown if currently breastfeeding.  Physical Exam:  General: alert and cooperative Lochia: appropriate Uterine Fundus: firm    Recent Labs  04/28/15 0610 04/29/15 0539  HGB 10.6* 9.1*  HCT 32.6* 28.2*    Assessment/Plan: Plan for discharge tomorrow   LOS: 1 day   Connor Foxworthy W 04/29/2015, 9:42 AM

## 2015-04-30 MED ORDER — AZITHROMYCIN 250 MG PO TABS: ORAL_TABLET | ORAL | Status: DC

## 2015-04-30 MED ORDER — IBUPROFEN 600 MG PO TABS: 600.0000 mg | ORAL_TABLET | Freq: Four times a day (QID) | ORAL | Status: DC

## 2015-04-30 NOTE — Discharge Instructions (Signed)
°Iron-Rich Diet ° °Iron is a mineral that helps your body to produce hemoglobin. Hemoglobin is a protein in your red blood cells that carries oxygen to your body's tissues. Eating too little iron may cause you to feel weak and tired, and it can increase your risk for infection. Eating enough iron is necessary for your body's metabolism, muscle function, and nervous system. °Iron is naturally found in many foods. It can also be added to foods or fortified in foods. There are two types of dietary iron: °· Heme iron. Heme iron is absorbed by the body more easily than nonheme iron. Heme iron is found in meat, poultry, and fish. °· Nonheme iron. Nonheme iron is found in dietary supplements, iron-fortified grains, beans, and vegetables. °You may need to follow an iron-rich diet if: °· You have been diagnosed with iron deficiency or iron-deficiency anemia. °· You have a condition that prevents you from absorbing dietary iron, such as: °¨ Infection in your intestines. °¨ Celiac disease. This involves long-lasting (chronic) inflammation of your intestines. °· You do not eat enough iron. °· You eat a diet that is high in foods that impair iron absorption. °· You have lost a lot of blood. °· You have heavy bleeding during your menstrual cycle. °· You are pregnant. °WHAT IS MY PLAN? °Your health care provider may help you to determine how much iron you need per day based on your condition. Generally, when a person consumes sufficient amounts of iron in the diet, the following iron needs are met: °· Men. °¨ 14-18 years old: 11 mg per day. °¨ 19-50 years old: 8 mg per day. °· Women.   °¨ 14-18 years old: 15 mg per day. °¨ 19-50 years old: 18 mg per day. °¨ Over 50 years old: 8 mg per day. °¨ Pregnant women: 27 mg per day. °¨ Breastfeeding women: 9 mg per day. °WHAT DO I NEED TO KNOW ABOUT AN IRON-RICH DIET? °· Eat fresh fruits and vegetables that are high in vitamin C along with foods that are high in iron. This will help  increase the amount of iron that your body absorbs from food, especially with foods containing nonheme iron. Foods that are high in vitamin C include oranges, peppers, tomatoes, and mango. °· Take iron supplements only as directed by your health care provider. Overdose of iron can be life-threatening. If you were prescribed iron supplements, take them with orange juice or a vitamin C supplement. °· Cook foods in pots and pans that are made from iron.   °· Eat nonheme iron-containing foods alongside foods that are high in heme iron. This helps to improve your iron absorption.   °· Certain foods and drinks contain compounds that impair iron absorption. Avoid eating these foods in the same meal as iron-rich foods or with iron supplements. These include: °¨ Coffee, black tea, and red wine. °¨ Milk, dairy products, and foods that are high in calcium. °¨ Beans, soybeans, and peas. °¨ Whole grains. °· When eating foods that contain both nonheme iron and compounds that impair iron absorption, follow these tips to absorb iron better.   °¨ Soak beans overnight before cooking. °¨ Soak whole grains overnight and drain them before using. °¨ Ferment flours before baking, such as using yeast in bread dough. °WHAT FOODS CAN I EAT? °Grains  °Iron-fortified breakfast cereal. Iron-fortified whole-wheat bread. Enriched rice. Sprouted grains. °Vegetables  °Spinach. Potatoes with skin. Green peas. Broccoli. Red and green bell peppers. Fermented vegetables. °Fruits  °Prunes. Raisins. Oranges. Strawberries. Mango. Grapefruit. °Meats and Other   Protein Sources Beef liver. Oysters. Beef. Shrimp. [place]. Chicken. [place]. Sardines. Chickpeas. Nuts. Tofu. Beverages Tomato juice. Fresh orange juice. Prune juice. Hibiscus tea. Fortified instant breakfast shakes. Condiments Tahini. Fermented soy sauce. Sweets and Desserts Black-strap molasses.  Other Wheat germ. The items listed above may not be a complete list of recommended foods or  beverages. Contact your dietitian for more options. WHAT FOODS ARE NOT RECOMMENDED? Grains Whole grains. Bran cereal. Bran flour. Oats. Vegetables Artichokes. Brussels sprouts. Kale. Fruits Blueberries. Raspberries. Strawberries. Figs. Meats and Other Protein Sources Soybeans. Products made from soy protein. Dairy Milk. Cream. Cheese. Yogurt. Cottage cheese. Beverages Coffee. Black tea. Red wine. Sweets and Desserts Cocoa. Chocolate. Ice cream. Other Basil. Oregano. Parsley. The items listed above may not be a complete list of foods and beverages to avoid. Contact your dietitian for more information.   This information is not intended to replace advice given to you by your health care provider. Make sure you discuss any questions you have with your health care provider.   Document Released: 09/10/2004 Document Revised: 02/17/2014 Document Reviewed: 08/24/2013 Elsevier Interactive Patient Education [employer].

## 2015-04-30 NOTE — Progress Notes (Signed)
Post Partum Day 2 Subjective: no complaints, up ad lib and tolerating PO  Objective: Blood pressure 132/62, pulse 88, temperature 98.6 F (37 C), temperature source Oral, resp. rate 17, height 5\' 9"  (1.753 m), weight 122.653 kg (270 lb 6.4 oz), last menstrual period 07/16/2014, SpO2 99 %, unknown if currently breastfeeding.  Physical Exam:  General: alert and cooperative Lochia: appropriate Uterine Fundus: firm    Recent Labs  04/28/15 0610 04/29/15 0539  HGB 10.6* 9.1*  HCT 32.6* 28.2*    Assessment/Plan: Discharge home   LOS: 2 days   Zaydenn Balaguer W 04/30/2015, 7:25 AM

## 2015-05-09 ENCOUNTER — Inpatient Hospital Stay (HOSPITAL_COMMUNITY)
Admission: AD | Admit: 2015-05-09 | Discharge: 2015-05-09 | Disposition: A | Discharge status: Home / Self Care | Payer: BLUE CROSS/BLUE SHIELD | Source: Ambulatory Visit | Attending: Obstetrics and Gynecology | Admitting: Obstetrics and Gynecology

## 2015-05-09 ENCOUNTER — Encounter (HOSPITAL_COMMUNITY): Payer: Self-pay

## 2015-05-09 DIAGNOSIS — Z818 Family history of other mental and behavioral disorders: Secondary | ICD-10-CM | POA: Diagnosis not present

## 2015-05-09 DIAGNOSIS — I10 Essential (primary) hypertension: Secondary | ICD-10-CM | POA: Diagnosis present

## 2015-05-09 DIAGNOSIS — Z79899 Other long term (current) drug therapy: Secondary | ICD-10-CM | POA: Insufficient documentation

## 2015-05-09 DIAGNOSIS — O165 Unspecified maternal hypertension, complicating the puerperium: Secondary | ICD-10-CM | POA: Insufficient documentation

## 2015-05-09 LAB — PROTEIN / CREATININE RATIO, URINE
Creatinine, Urine: 242 mg/dL
PROTEIN CREATININE RATIO: 0.07 mg/mg{creat} (ref 0.00–0.15)
Total Protein, Urine: 16 mg/dL

## 2015-05-09 NOTE — MAU Provider Note (Signed)
History     CSN: [id number]  Arrival date and time: 05/09/15 [id number]   First Provider Initiated Contact with Patient 05/09/15 1902       Chief Complaint  Patient presents with  . Postpartum Complications  . Hypertension   HPI Crystal Sharp is a 35 y.o. [id number] who presents from office for elevated BP. Pt is 11 days s/p SVD. Has history of chronic hypertension. Seen in office this morning with severe range BPs per patient. Was prescribed procardia & labs obtained. Pt called by Dr. Ellyn HackBovard and told to come to MAU for BP monitoring & protein creatinine ratio.  Patient denies headache, vision changes, epigastric pain, n/v, chest pain, or SOB.  Reports having headaches over the weekend but denies now.   OB History    Gravida Para Term Preterm AB TAB SAB Ectopic Multiple Living   3 2 2  1 1    0 2      History reviewed. No pertinent past medical history.  Past Surgical History  Procedure Laterality Date  . Wisdom tooth extraction      Family History  Problem Relation Age of Onset  . Hypertension Father     Social History  Substance Use Topics  . Smoking status: Never Smoker   . Smokeless tobacco: None  . Alcohol Use: Yes     Comment:  NONE WHILE PREG    Allergies: No Known Allergies  Prescriptions prior to admission  Medication Sig Dispense Refill Last Dose  . ibuprofen (ADVIL,MOTRIN) 600 MG tablet Take 1 tablet (600 mg total) by mouth every 6 (six) hours. 30 tablet 0 05/09/2015 at Unknown time  . NIFEdipine (PROCARDIA-XL/ADALAT-CC/NIFEDICAL-XL) 30 MG 24 hr tablet Take 30 mg by mouth daily.   05/09/2015 at Unknown time  . Prenatal Vit-Fe Fumarate-FA (PRENATAL COMPLETE) 14-0.4 MG TABS Use as directed on package (Patient taking differently: Take 2 tablets by mouth daily. ) 60 each 0 05/09/2015 at Unknown time  . azithromycin (ZITHROMAX) 250 MG tablet Take one tablet daily to finish prescription (Patient not taking: Reported on 05/09/2015) 5 each 0     Review of Systems   Constitutional: Negative.   HENT: Negative.   Respiratory: Negative.   Cardiovascular: Negative.   Gastrointestinal: Negative.    Physical Exam   Blood pressure 139/93, pulse 80, temperature 98.2 F (36.8 C), temperature source Oral, resp. rate 18, height 5\' 9"  (1.753 m), weight 248 lb (112.492 kg), last menstrual period 07/16/2014, SpO2 99 %, unknown if currently breastfeeding.  Patient Vitals for the past 24 hrs:  BP Temp Temp src Pulse Resp SpO2 Height Weight  05/09/15 1930 132/79 mmHg - - 72 - - - -  05/09/15 1915 127/73 mmHg - - 70 - - - -  05/09/15 1900 139/93 mmHg - - 80 - - - -  05/09/15 1843 152/87 mmHg 98.2 F (36.8 C) Oral 66 18 99 % 5\' 9"  (1.753 m) 248 lb (112.492 kg)     Physical Exam  Nursing note and vitals reviewed. Constitutional: She is oriented to person, place, and time. She appears well-developed and well-nourished. No distress.  HENT:  Head: Normocephalic and atraumatic.  Eyes: Conjunctivae are normal. Right eye exhibits no discharge. Left eye exhibits no discharge. No scleral icterus.  Neck: Normal range of motion.  Cardiovascular: Normal rate, regular rhythm and normal heart sounds.   No murmur heard. Respiratory: Effort normal and breath sounds normal. No respiratory distress. She has no wheezes.  Neurological: She is alert and oriented  to person, place, and time. She has normal reflexes.  No clonus  Skin: Skin is warm and dry. She is not diaphoretic.  Psychiatric: She has a normal mood and affect. Her behavior is normal. Judgment and thought content normal.    MAU Course  Procedures Results for orders placed or performed during the hospital encounter of 05/09/15 (from the past 24 hour(s))  Protein / creatinine ratio, urine     Status: None   Collection Time: 05/09/15  6:34 PM  Result Value Ref Range   Creatinine, Urine 242.00 mg/dL   Total Protein, Urine 16 mg/dL   Protein Creatinine Ratio 0.07 0.00 - 0.15 mg/mg[Cre]    MDM No severe range  BPs PCR normal S/w Dr. Ellyn Hack, ok to discharge home. Pt should have f/u BP check in office in 1 week  Assessment and Plan  A: 1. Postpartum hypertension     P: Discharge home Continue taking procardia as prescribed Discussed reasons to return to MAU Keep scheduled f/u in office on Friday  Judeth Horn 05/09/2015, 7:02 PM

## 2015-05-09 NOTE — MAU Note (Signed)
Urine in lab 

## 2015-05-09 NOTE — MAU Note (Signed)
Pt states she was sent from office today for elevated BP's and lab work. Has a slight HA now. Denies vision changes, RUQ pain.

## 2015-05-09 NOTE — Discharge Instructions (Signed)

## 2016-03-09 ENCOUNTER — Encounter (HOSPITAL_COMMUNITY): Payer: Self-pay | Admitting: *Deleted

## 2016-03-09 ENCOUNTER — Emergency Department (HOSPITAL_COMMUNITY)
Admission: EM | Admit: 2016-03-09 | Discharge: 2016-03-09 | Disposition: A | Discharge status: Home / Self Care | Payer: BLUE CROSS/BLUE SHIELD | Attending: Emergency Medicine | Admitting: Emergency Medicine

## 2016-03-09 DIAGNOSIS — R21 Rash and other nonspecific skin eruption: Secondary | ICD-10-CM | POA: Diagnosis present

## 2016-03-09 DIAGNOSIS — L42 Pityriasis rosea: Secondary | ICD-10-CM | POA: Insufficient documentation

## 2016-03-09 DIAGNOSIS — Z79899 Other long term (current) drug therapy: Secondary | ICD-10-CM | POA: Diagnosis not present

## 2016-03-09 MED ORDER — PREDNISONE 20 MG PO TABS: ORAL_TABLET | ORAL | 0 refills | Status: DC

## 2016-03-09 NOTE — ED Provider Notes (Signed)
[facility]-EMERGENCY DEPT Provider Note   CSN: [id number] Arrival date & time: 03/09/16  1030   By signing my name below, I, Freida Busmaniana Omoyeni, attest that this documentation has been prepared under the direction and in the presence of Arthor CaptainAbigail Lakeithia Rasor, PA-C. Electronically Signed: Freida Busmaniana Omoyeni, Scribe. 03/09/2016. 1:03 PM.   History   Chief Complaint Chief Complaint  Patient presents with  . Rash     The history is provided by the patient. No language interpreter was used.    HPI Comments:  Crystal Sharp is a 36 y.o. female who presents to the Emergency Department complaining of a diffuse pruritic rash x ~ 4 weeks. She states the rash started on her forehead and then spread. She denies recent changes in food/lotions/soaps and is unsure what may have caused the rash. No alleviating factors or sick contacts noted. Pt has no other complaints or associated symptoms at this time. She has an appointment with a dermatologist scheduled for March 20th 2018.     History reviewed. No pertinent past medical history.  Patient Active Problem List   Diagnosis Date Noted  . Normal labor 04/28/2015  . NSVD (normal spontaneous vaginal delivery) 04/28/2015  . SVD (spontaneous vaginal delivery) 06/25/2013    Past Surgical History:  Procedure Laterality Date  . WISDOM TOOTH EXTRACTION      OB History    Gravida Para Term Preterm AB Living   3 2 2   1 2    SAB TAB Ectopic Multiple Live Births     1   0 2       Home Medications    Prior to Admission medications   Medication Sig Start Date End Date Taking? Authorizing Provider  ibuprofen (ADVIL,MOTRIN) 600 MG tablet Take 1 tablet (600 mg total) by mouth every 6 (six) hours. 04/30/15   Huel CoteKathy Richardson, MD  NIFEdipine (PROCARDIA-XL/ADALAT-CC/NIFEDICAL-XL) 30 MG 24 hr tablet Take 30 mg by mouth daily.    Historical Provider, MD  Prenatal Vit-Fe Fumarate-FA (PRENATAL COMPLETE) 14-0.4 MG TABS Use as directed on package Patient taking differently:  Take 2 tablets by mouth daily.  10/22/12   Graylon GoodZachary H Baker, PA-C    Family History Family History  Problem Relation Age of Onset  . Hypertension Father     Social History Social History  Substance Use Topics  . Smoking status: Never Smoker  . Smokeless tobacco: Not on file  . Alcohol use Yes     Comment:  NONE WHILE PREG     Allergies   Patient has no known allergies.   Review of Systems Review of Systems  Constitutional: Negative for fever.  Skin: Positive for rash.     Physical Exam Updated Vital Signs There were no vitals taken for this visit.  Physical Exam  Constitutional: She is oriented to person, place, and time. She appears well-developed and well-nourished. No distress.  HENT:  Head: Normocephalic.  mild facial swelling noted   Eyes: Conjunctivae are normal.  Neck: Normal range of motion. Neck supple.  Cardiovascular: Normal rate.   Pulmonary/Chest: Effort normal.  Abdominal: She exhibits no distension.  Musculoskeletal: Normal range of motion.  Neurological: She is alert and oriented to person, place, and time.  Skin: Skin is warm and dry. Rash noted.  Multiple annular lesions with hyperpigmentation, crusting, and central clearing  Multiple confluent maculopapular erythema lesions with peeling on the face hyperpigmentation and hyperkeratosis behind ears and right mandibular angle. No signs of secondary infection   Psychiatric: She has a normal  mood and affect.  Nursing note and vitals reviewed.     COORDINATION OF CARE:  1:02 PM Discussed treatment plan with pt at bedside and pt agreed to plan.  Labs (all labs ordered are listed, but only abnormal results are displayed) Labs Reviewed - No data to display  EKG  EKG Interpretation None       Radiology No results found.  Procedures Procedures (including critical care time)  Medications Ordered in ED Medications - No data to display   Initial Impression / Assessment and Plan / ED  Course  I have reviewed the triage vital signs and the nursing notes.  Pertinent labs & imaging results that were available during my care of the patient were reviewed by me and considered in my medical decision making (see chart for details).       Patient appears to have pityriasis. I feel she may also have a component of atopic dermatitis of the face. This does not appear fungal. Patient with viral exanthem. Discussed that this is self-limited.  No signs of secondary infection. Discharged with symptomatic treatment. Follow up with Dermatology. Return precautions discussed. Pt is safe for discharge at this time.   Final Clinical Impressions(s) / ED Diagnoses   Final diagnoses:  Pityriasis rosea    New Prescriptions Discharge Medication List as of 03/09/2016  1:26 PM    START taking these medications   Details  predniSONE (DELTASONE) 20 MG tablet 3 tabs po daily x 3 days, then 2 tabs x 3 days, then 1.5 tabs x 3 days, then 1 tab x 3 days, then 0.5 tabs x 3 days, Print       I personally performed the services described in this documentation, which was scribed in my presence. The recorded information has been reviewed and is accurate.       Arthor Captain, PA-C 03/10/16 2314    Margarita Grizzle, MD 03/12/16 [phone]

## 2016-03-09 NOTE — Discharge Instructions (Signed)
Get help right away if: °Your back pain is severe. °You are unable to stand or walk. °You develop pain in your legs. °You develop weakness in your buttocks or legs. °You have difficulty controlling when you urinate or when you have a bowel movement. °

## 2016-03-09 NOTE — ED Triage Notes (Signed)
Pt reports ongoing rash and itching to entire body including her face. Has been to an ucc and no relief creams prescribed.

## 2016-03-09 NOTE — ED Notes (Signed)
Declined W/C at D/C and was escorted to lobby by RN. 

## 2016-12-22 IMAGING — US US OB COMP LESS 14 WK
1 series · 13 of 28 positions shown · non-contrast
Comparison: Pelvic ultrasound performed 06/25/2013

CLINICAL DATA: Acute onset of vaginal bleeding.  Initial encounter.

EXAM:
OBSTETRIC <14 WK US AND TRANSVAGINAL OB US
TECHNIQUE: Both transabdominal and transvaginal ultrasound examinations were
performed for complete evaluation of the gestation as well as the
maternal uterus, adnexal regions, and pelvic cul-de-sac.
Transvaginal technique was performed to assess early pregnancy.

[Series 1: us ob comp less 14 wk · 13 of 51 slices shown]
[im 2/51]
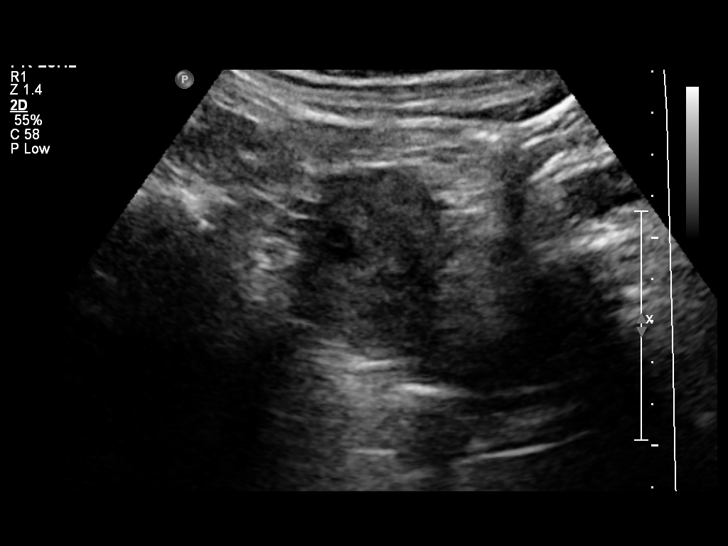
[im 6/51]
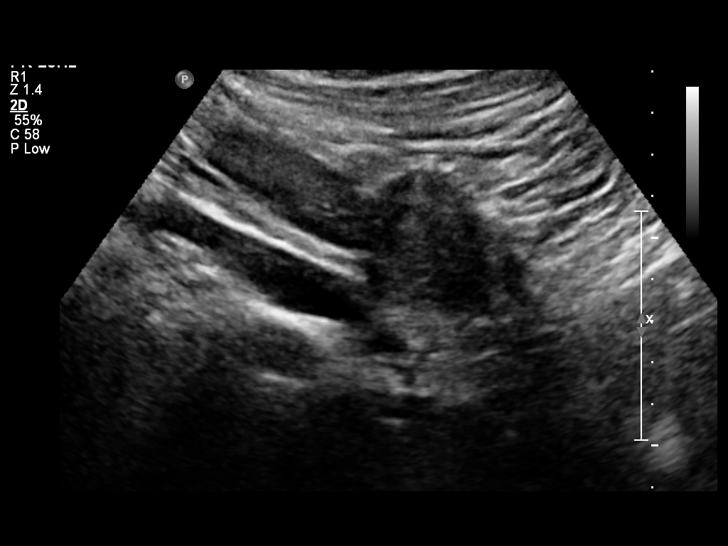
[im 10/51]
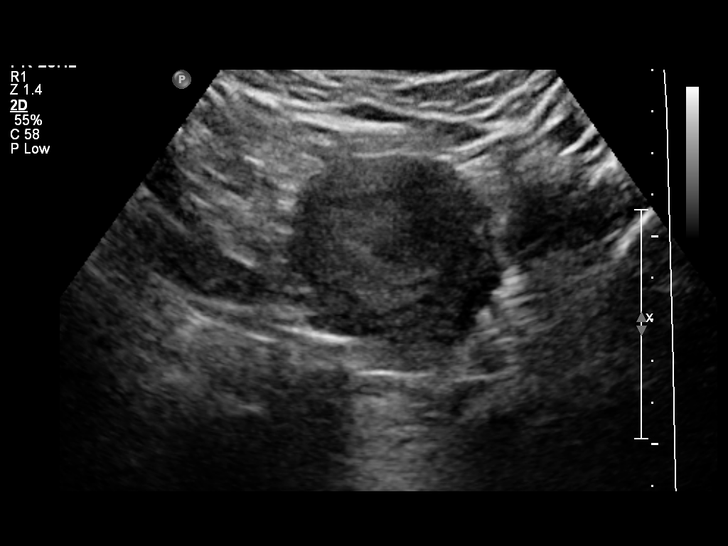
[im 13/51]
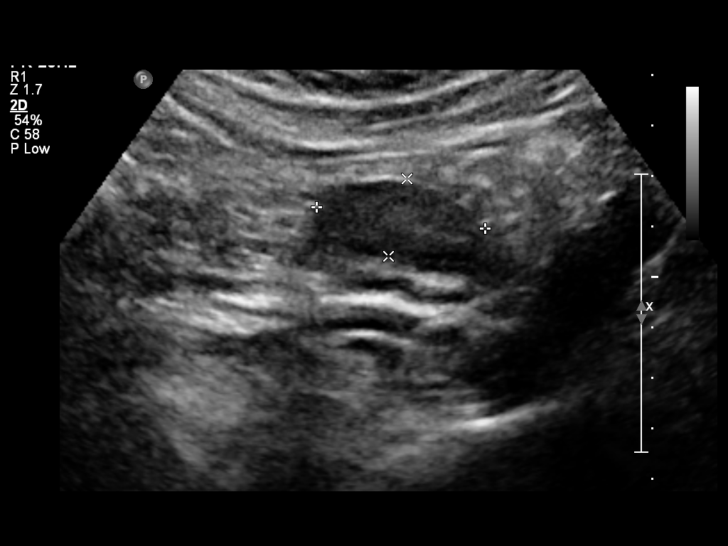
[im 17/51]
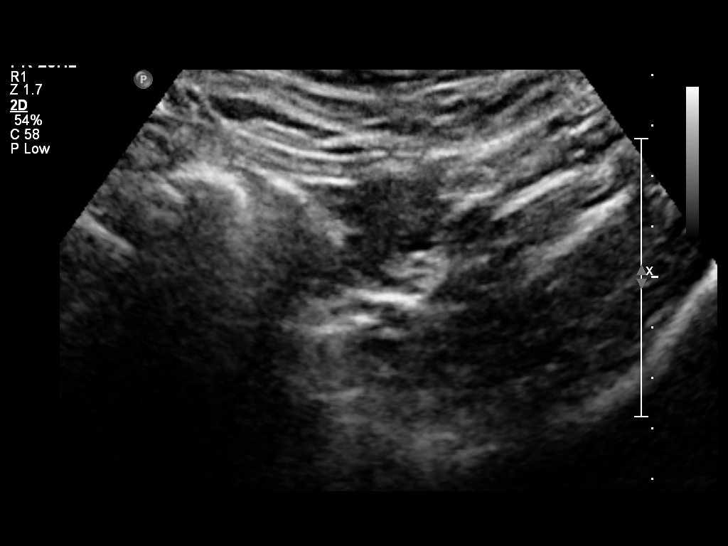
[im 21/51]
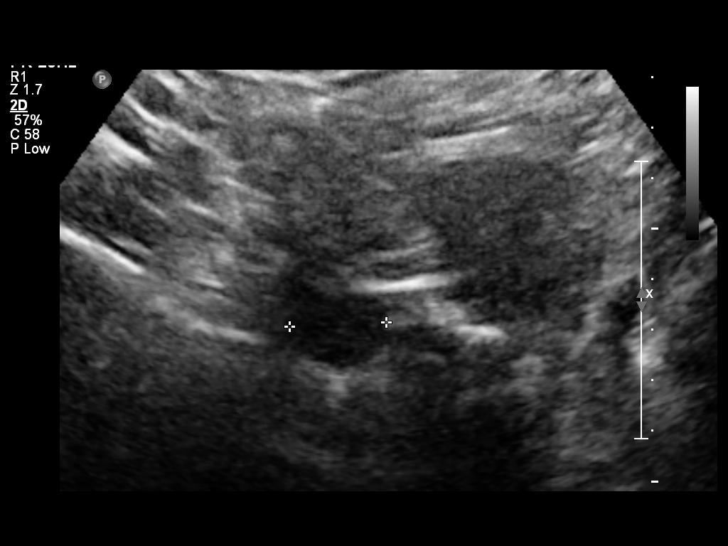
[im 26/51]
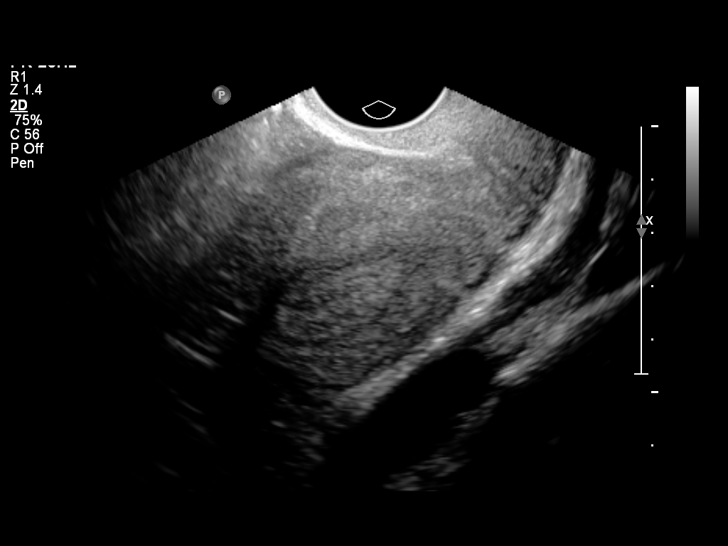
[im 30/51]
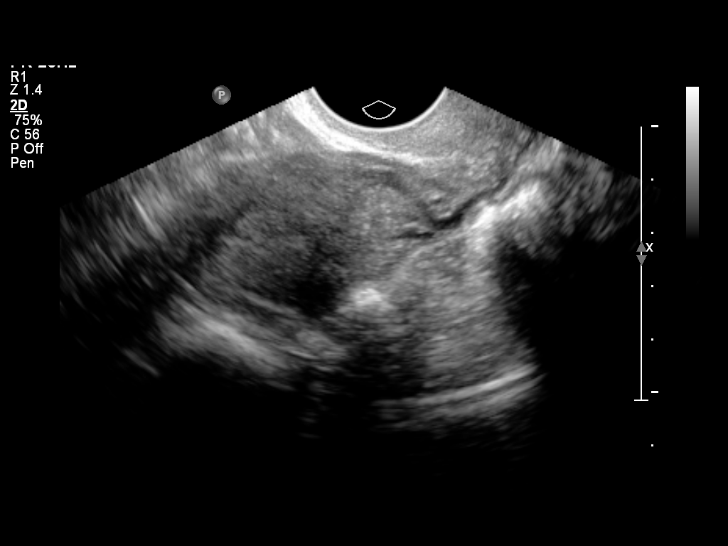
[im 34/51]
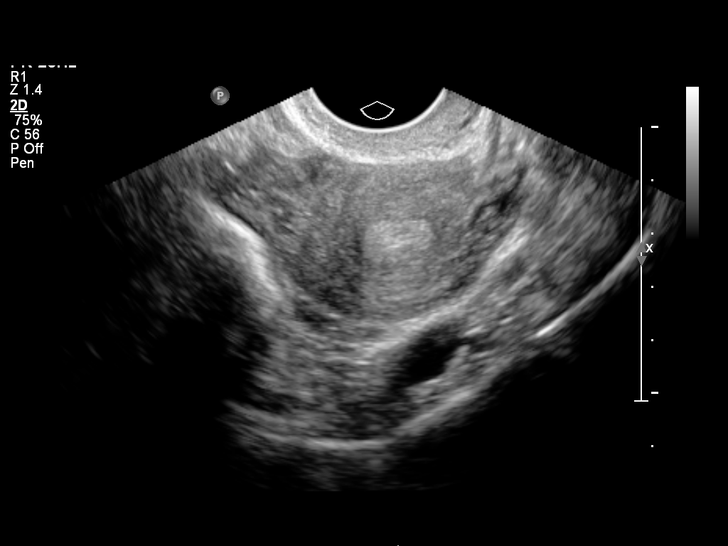
[im 38/51]
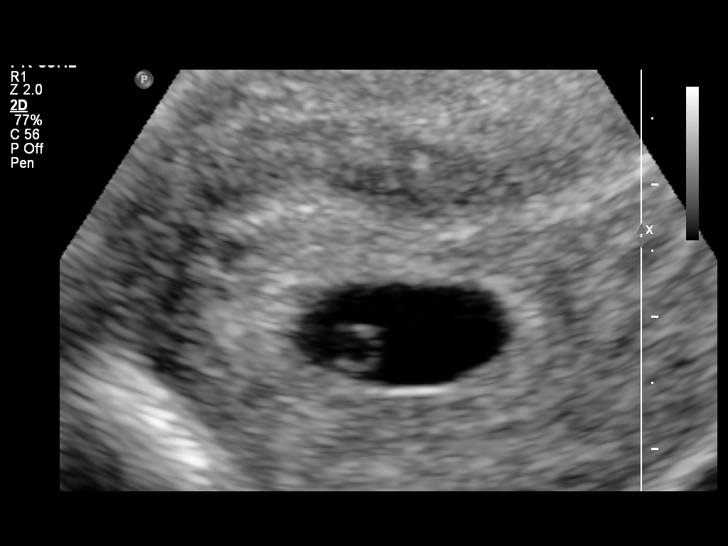
[im 41/51]
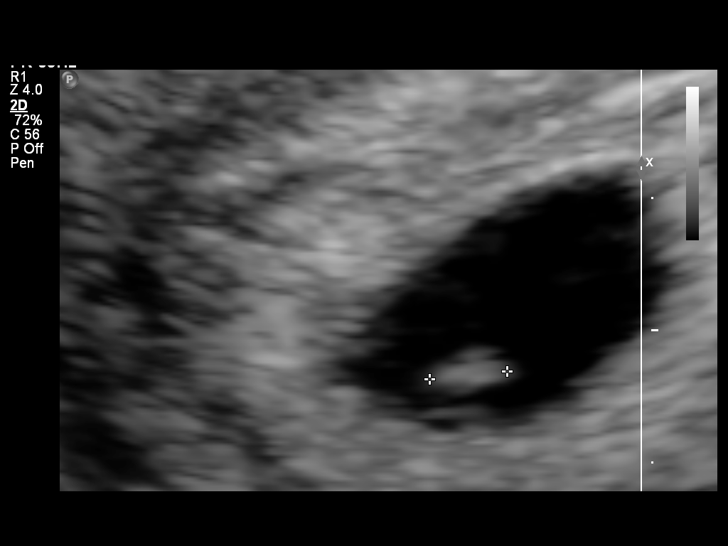
[im 45/51]
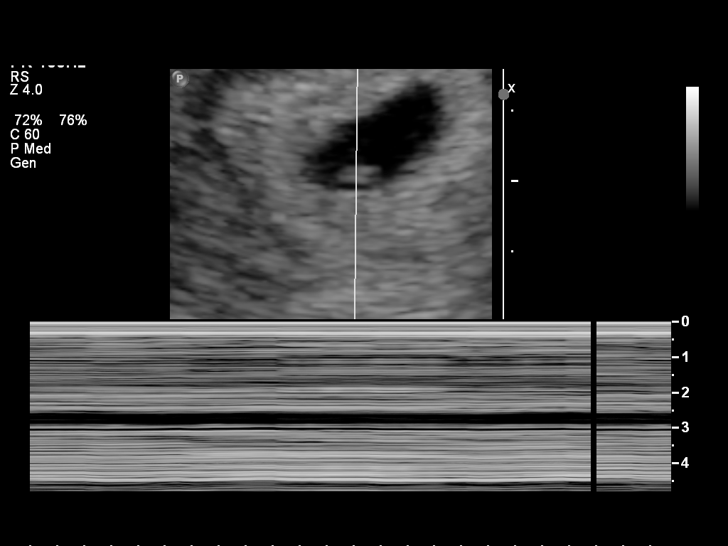
[im 49/51]
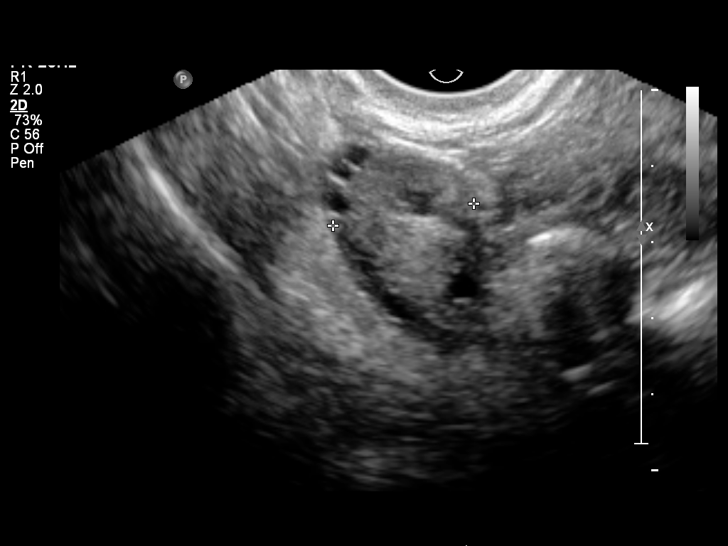

[13 of 28 positions shown; findings below may reference images not displayed]

FINDINGS: Intrauterine gestational sac: Visualized/normal in shape.

Yolk sac:  Yes

Embryo:  Yes

Cardiac Activity: Yes

Heart Rate: 82  bpm

CRL:  2.6  mm   5 w   6 d                  US EDC: 04/26/2015

Maternal uterus/adnexae: No subchorionic hemorrhage is noted. The
uterus is otherwise unremarkable.

The ovaries are within normal limits. The right ovary measures 3.0 x
1.2 x 1.9 cm, while the left ovary measures 3.4 x 1.6 x 2.2 cm. No
suspicious adnexal masses are seen; there is no evidence for ovarian
torsion.

No free fluid is seen within the pelvic cul-de-sac.
IMPRESSION: Single live intrauterine pregnancy noted, with a crown-rump length
of 3 mm, corresponding to a gestational age of 5 weeks 6 days. The
heart rate remains within normal limits for the size of the embryo.
This matches the gestational age of 6 weeks 3 days by LMP,
reflecting an estimated date of delivery April 22, 2015.

## 2019-09-20 LAB — OB RESULTS CONSOLE GC/CHLAMYDIA
Chlamydia: NEGATIVE
Gonorrhea: NEGATIVE

## 2019-09-20 LAB — OB RESULTS CONSOLE HIV ANTIBODY (ROUTINE TESTING): HIV: NONREACTIVE

## 2019-09-20 LAB — OB RESULTS CONSOLE HEPATITIS B SURFACE ANTIGEN: Hepatitis B Surface Ag: NEGATIVE

## 2019-09-22 LAB — OB RESULTS CONSOLE RUBELLA ANTIBODY, IGM: Rubella: IMMUNE

## 2020-02-11 NOTE — L&D Delivery Note (Signed)
DELIVERY NOTE  Pt complete and at +2 station with urge to push. Epidural controlling pain. Pt pushed and delivered a viable female infant in LOA position. Nuchal x1, loose, reduced at perineum. Anterior and posterior shoulders spontaneously delivered with next two pushes; body easily followed next. Infant placed on mothers abdomen and bulb suction of mouth and nose performed. Cord was then clamped and cut by FOB. Cord blood obtained, 3VC. Baby had a vigorous spontaneous cry noted. Placenta then delivered at 1549 intact. Fundal massage performed and pitocin per protocol. Fundus firm however LUS aotny noted. Resolved with massage and PR cytotec, NO lacerations. Mother and baby stable. Counts correct EBL 400cc.   Infant time: 53 Gender: female Placenta time: 1549 Apgars: 9/9 Weight: pending skin-to-skin

## 2020-04-04 LAB — OB RESULTS CONSOLE GBS: GBS: NEGATIVE

## 2020-04-18 ENCOUNTER — Encounter (HOSPITAL_COMMUNITY): Payer: Self-pay | Admitting: Obstetrics and Gynecology

## 2020-04-18 ENCOUNTER — Other Ambulatory Visit: Payer: Self-pay

## 2020-04-18 ENCOUNTER — Inpatient Hospital Stay (HOSPITAL_COMMUNITY)
Admission: AD | Admit: 2020-04-18 | Discharge: 2020-04-22 | DRG: 807 | Disposition: A | Discharge status: Home / Self Care | Payer: BC Managed Care – PPO | Attending: Obstetrics and Gynecology | Admitting: Obstetrics and Gynecology

## 2020-04-18 ENCOUNTER — Encounter (HOSPITAL_COMMUNITY): Payer: Self-pay | Admitting: Certified Registered Nurse Anesthetist

## 2020-04-18 DIAGNOSIS — O99214 Obesity complicating childbirth: Secondary | ICD-10-CM | POA: Diagnosis present

## 2020-04-18 DIAGNOSIS — E669 Obesity, unspecified: Secondary | ICD-10-CM | POA: Diagnosis present

## 2020-04-18 DIAGNOSIS — Z3490 Encounter for supervision of normal pregnancy, unspecified, unspecified trimester: Secondary | ICD-10-CM

## 2020-04-18 DIAGNOSIS — Z3A38 38 weeks gestation of pregnancy: Secondary | ICD-10-CM | POA: Diagnosis not present

## 2020-04-18 DIAGNOSIS — Z20822 Contact with and (suspected) exposure to covid-19: Secondary | ICD-10-CM | POA: Diagnosis present

## 2020-04-18 DIAGNOSIS — R030 Elevated blood-pressure reading, without diagnosis of hypertension: Secondary | ICD-10-CM | POA: Diagnosis not present

## 2020-04-18 DIAGNOSIS — O99893 Other specified diseases and conditions complicating puerperium: Secondary | ICD-10-CM | POA: Diagnosis not present

## 2020-04-18 LAB — COMPREHENSIVE METABOLIC PANEL
ALT: 11 U/L (ref 0–44)
AST: 20 U/L (ref 15–41)
Albumin: 2.7 g/dL — ABNORMAL LOW (ref 3.5–5.0)
Alkaline Phosphatase: 126 U/L (ref 38–126)
Anion gap: 8 (ref 5–15)
BUN: 5 mg/dL — ABNORMAL LOW (ref 6–20)
CO2: 20 mmol/L — ABNORMAL LOW (ref 22–32)
Calcium: 8.7 mg/dL — ABNORMAL LOW (ref 8.9–10.3)
Chloride: 106 mmol/L (ref 98–111)
Creatinine, Ser: 0.41 mg/dL — ABNORMAL LOW (ref 0.44–1.00)
GFR, Estimated: >60 mL/min (ref >60)
Glucose, Bld: 69 mg/dL — ABNORMAL LOW (ref 70–99)
Potassium: 3.8 mmol/L (ref 3.5–5.1)
Sodium: 134 mmol/L — ABNORMAL LOW (ref 135–145)
Total Bilirubin: 0.6 mg/dL (ref 0.3–1.2)
Total Protein: 6 g/dL — ABNORMAL LOW (ref 6.5–8.1)

## 2020-04-18 LAB — RESP PANEL BY RT-PCR (FLU A&B, COVID) ARPGX2
Influenza A by PCR: NEGATIVE
Influenza B by PCR: NEGATIVE
SARS Coronavirus 2 by RT PCR: NEGATIVE

## 2020-04-18 LAB — CBC
HCT: 35.1 % — ABNORMAL LOW (ref 36.0–46.0)
Hemoglobin: 11.6 g/dL — ABNORMAL LOW (ref 12.0–15.0)
MCH: 28.9 pg (ref 26.0–34.0)
MCHC: 33 g/dL (ref 30.0–36.0)
MCV: 87.5 fL (ref 80.0–100.0)
Platelets: 227 10*3/uL (ref 150–400)
RBC: 4.01 MIL/uL (ref 3.87–5.11)
RDW: 14.7 % (ref 11.5–15.5)
WBC: 11.4 10*3/uL — ABNORMAL HIGH (ref 4.0–10.5)
nRBC: 0 % (ref 0.0–0.2)

## 2020-04-18 LAB — TYPE AND SCREEN
ABO/RH(D): O POS
Antibody Screen: NEGATIVE

## 2020-04-18 MED ORDER — TERBUTALINE SULFATE 1 MG/ML IJ SOLN: 0.2500 mg | Freq: Once | INTRAMUSCULAR | Status: DC | PRN

## 2020-04-18 MED ORDER — MISOPROSTOL 25 MCG QUARTER TABLET
25.0000 ug | ORAL_TABLET | Freq: Once | ORAL | Status: AC
  Administered 2020-04-18: 25 ug via VAGINAL
  Filled 2020-04-18: qty 1

## 2020-04-18 MED ORDER — MISOPROSTOL 25 MCG QUARTER TABLET
25.0000 ug | ORAL_TABLET | Freq: Once | ORAL | Status: AC
  Administered 2020-04-18: 25 ug via VAGINAL

## 2020-04-18 MED ORDER — ONDANSETRON HCL 4 MG/2ML IJ SOLN: 4.0000 mg | Freq: Four times a day (QID) | INTRAMUSCULAR | Status: DC | PRN

## 2020-04-18 MED ORDER — MISOPROSTOL 25 MCG QUARTER TABLET
ORAL_TABLET | ORAL | Status: AC
  Filled 2020-04-18: qty 1

## 2020-04-18 MED ORDER — OXYTOCIN-SODIUM CHLORIDE 30-0.9 UT/500ML-% IV SOLN
2.5000 [IU]/h | INTRAVENOUS | Status: DC
  Filled 2020-04-18 (×2): qty 500

## 2020-04-18 MED ORDER — LACTATED RINGERS IV SOLN
500.0000 mL | INTRAVENOUS | Status: DC | PRN
Start: 2020-04-18 — End: 2020-04-20
  Administered 2020-04-20: 1000 mL via INTRAVENOUS

## 2020-04-18 MED ORDER — OXYTOCIN BOLUS FROM INFUSION
333.0000 mL | Freq: Once | INTRAVENOUS | Status: AC
  Administered 2020-04-20: 333 mL via INTRAVENOUS

## 2020-04-18 MED ORDER — LIDOCAINE HCL (PF) 1 % IJ SOLN: 30.0000 mL | INTRAMUSCULAR | Status: DC | PRN

## 2020-04-18 MED ORDER — SOD CITRATE-CITRIC ACID 500-334 MG/5ML PO SOLN: 30.0000 mL | ORAL | Status: DC | PRN

## 2020-04-18 MED ORDER — BUTORPHANOL TARTRATE 1 MG/ML IJ SOLN: 1.0000 mg | INTRAMUSCULAR | Status: DC | PRN

## 2020-04-18 MED ORDER — LACTATED RINGERS IV SOLN: INTRAVENOUS | Status: DC

## 2020-04-18 MED ORDER — ACETAMINOPHEN 325 MG PO TABS: 650.0000 mg | ORAL_TABLET | ORAL | Status: DC | PRN

## 2020-04-18 NOTE — H&P (Signed)
Crystal Sharp is a 40 y.o. female [id number] at (EDD 05/02/20 by 8 week [place] and unsure LMP) presenting to L&D from office when had routine NST for AMA/obesity and NR with recurrent variable decelerations, good variability.  She is admitted for delivery given term status with questionable tracing. Prenatal care significant for : 1) AMA --declined NIPT or other genetic screens 2) Obesity--BMI 40+ 3) h/o PIH x 2, on baby ASA and BP normal so far   OB History    Gravida  3   Para  2   Term  2   Preterm      AB  1   Living  2     SAB      IAB  1   Ectopic      Multiple  0   Live Births  2         NSVD 2015 7#10oz NSVD 2017 7#9oz EAB x 1  No past medical history on file. Past Surgical History:  Procedure Laterality Date  . WISDOM TOOTH EXTRACTION     Family History: family history includes Hypertension in her father. Social History:  reports that she has never smoked. She does not have any smokeless tobacco history on file. She reports current alcohol use. She reports that she does not use drugs.     Maternal Diabetes: No Genetic Screening: Declined Maternal Ultrasounds/Referrals: Normal Fetal Ultrasounds or other Referrals:  None Maternal Substance Abuse:  No Significant Maternal Medications:  None Significant Maternal Lab Results:  Group B Strep negative Other Comments:  None  Review of Systems  Constitutional: Negative for fever.  Gastrointestinal: Negative for abdominal pain.   Maternal Medical History:  Contractions: Frequency: irregular.   Perceived severity is mild.    Fetal activity: Perceived fetal activity is normal.    Prenatal complications: AMA, Obesity  Prenatal Complications - Diabetes: none.      unknown if currently breastfeeding. Maternal Exam:  Uterine Assessment: Contraction strength is mild.  Contraction frequency is irregular.   Abdomen: Patient reports no abdominal tenderness. Fetal presentation: vertex  Introitus: Normal  vulva. Normal vagina.    Physical Exam Cardiovascular:     Rate and Rhythm: Normal rate and regular rhythm.  Abdominal:     Palpations: Abdomen is soft.  Genitourinary:    General: Normal vulva.  Neurological:     Mental Status: She is alert.  Psychiatric:        Mood and Affect: Mood normal.    Cervic 50/cl/-3  Prenatal labs: ABO, Rh:  O positive Antibody:  neg Rubella:  Immune RPR:   NR HBsAg:   Neg HIV:   NR GBS:   neg Hgb AA  Assessment/Plan: Pt for IOL given questionable tracing at term in office.  Tracing now category 1 with baseline 120-130, accels and no decels. Bedside US confirms vertex.  Will begin with ripening and see how responds.  Once has active labor, often goes very quickly.  Does not plan epidural but open to it if needed.    Crystal Sharp 04/18/2020, 5:04 PM

## 2020-04-19 LAB — RPR: RPR Ser Ql: NONREACTIVE

## 2020-04-19 MED ORDER — OXYTOCIN-SODIUM CHLORIDE 30-0.9 UT/500ML-% IV SOLN
1.0000 m[IU]/min | INTRAVENOUS | Status: DC
  Administered 2020-04-19 (×2): 2 m[IU]/min via INTRAVENOUS
  Filled 2020-04-19: qty 500

## 2020-04-19 MED ORDER — TERBUTALINE SULFATE 1 MG/ML IJ SOLN: 0.2500 mg | Freq: Once | INTRAMUSCULAR | Status: DC | PRN

## 2020-04-19 NOTE — Progress Notes (Signed)
Feeling occ ctx Afeb, VSS FHT- 130-140, mostly Cat I, has occasional variable decel, irreg ctx VE-1/40/-3, vtx, membranes stripped Will increase pitocin, monitor progress, plan AROM when more dilated

## 2020-04-19 NOTE — Progress Notes (Signed)
Feeling some ctx Afeb, VSS FHT- 130-140, mod variability, + accels, having occasional decels that are difficult to label, so Cat II, irreg ctx VE-1+/50/-3, vtx  Will pause pitocin for an hour, then restart, try again for AROM in a few hours

## 2020-04-19 NOTE — Progress Notes (Signed)
Feeling more ctx but still not strong Afeb, VSS FHT-140-150, mod variability, + accels, variable decels, Cat II, ctx q 3-4 min VE-1-2/70/-2, more anterior, vtx  Continue pitocin, monitor FHT and progress, hopefully starting to change

## 2020-04-19 NOTE — Progress Notes (Signed)
Feeling more pressure with ctx Afeb, VSS FHT-130s, mod variability, + accels, occasional variable decel and prolonged decel after SROM-Cat II, ctx q 3-5 min VE-1-2/40/-3, vtx, AROM clear, unable to apply FSE after prolonged decel  AROM done for augmentation, had a prolonged decel after which resolved, unable to apply FSE.  Pitocin had been turned off with decel, now back on at 80mu/min.  Hopefully AROM will help apply vtx to cervix and augment labor, will monitor closely

## 2020-04-20 ENCOUNTER — Encounter (HOSPITAL_COMMUNITY): Payer: Self-pay | Admitting: Obstetrics and Gynecology

## 2020-04-20 ENCOUNTER — Inpatient Hospital Stay (HOSPITAL_COMMUNITY): Payer: BC Managed Care – PPO | Admitting: Anesthesiology

## 2020-04-20 MED ORDER — COCONUT OIL OIL: 1.0000 "application " | TOPICAL_OIL | Status: DC | PRN

## 2020-04-20 MED ORDER — EPHEDRINE 5 MG/ML INJ
10.0000 mg | INTRAVENOUS | Status: DC | PRN
  Filled 2020-04-20: qty 10

## 2020-04-20 MED ORDER — LACTATED RINGERS IV SOLN
500.0000 mL | Freq: Once | INTRAVENOUS | Status: AC
  Administered 2020-04-20: 500 mL via INTRAVENOUS

## 2020-04-20 MED ORDER — DIBUCAINE (PERIANAL) 1 % EX OINT: 1.0000 "application " | TOPICAL_OINTMENT | CUTANEOUS | Status: DC | PRN

## 2020-04-20 MED ORDER — WITCH HAZEL-GLYCERIN EX PADS: 1.0000 "application " | MEDICATED_PAD | CUTANEOUS | Status: DC | PRN

## 2020-04-20 MED ORDER — EPHEDRINE 5 MG/ML INJ: 10.0000 mg | INTRAVENOUS | Status: DC | PRN

## 2020-04-20 MED ORDER — ACETAMINOPHEN 325 MG PO TABS: 650.0000 mg | ORAL_TABLET | ORAL | Status: DC | PRN

## 2020-04-20 MED ORDER — ZOLPIDEM TARTRATE 5 MG PO TABS: 5.0000 mg | ORAL_TABLET | Freq: Every evening | ORAL | Status: DC | PRN

## 2020-04-20 MED ORDER — TETANUS-DIPHTH-ACELL PERTUSSIS 5-2.5-18.5 LF-MCG/0.5 IM SUSY: 0.5000 mL | PREFILLED_SYRINGE | Freq: Once | INTRAMUSCULAR | Status: DC

## 2020-04-20 MED ORDER — PHENYLEPHRINE 40 MCG/ML (10ML) SYRINGE FOR IV PUSH (FOR BLOOD PRESSURE SUPPORT): 80.0000 ug | PREFILLED_SYRINGE | INTRAVENOUS | Status: DC | PRN

## 2020-04-20 MED ORDER — FENTANYL-BUPIVACAINE-NACL 0.5-0.125-0.9 MG/250ML-% EP SOLN: 12.0000 mL/h | EPIDURAL | Status: DC | PRN

## 2020-04-20 MED ORDER — PRENATAL MULTIVITAMIN CH
1.0000 | ORAL_TABLET | Freq: Every day | ORAL | Status: DC
  Administered 2020-04-21 – 2020-04-22 (×2): 1 via ORAL
  Filled 2020-04-20 (×2): qty 1

## 2020-04-20 MED ORDER — DIPHENHYDRAMINE HCL 50 MG/ML IJ SOLN: 12.5000 mg | INTRAMUSCULAR | Status: DC | PRN

## 2020-04-20 MED ORDER — ONDANSETRON HCL 4 MG PO TABS: 4.0000 mg | ORAL_TABLET | ORAL | Status: DC | PRN

## 2020-04-20 MED ORDER — DIPHENHYDRAMINE HCL 25 MG PO CAPS
25.0000 mg | ORAL_CAPSULE | Freq: Four times a day (QID) | ORAL | Status: DC | PRN
Start: 2020-04-20 — End: 2020-04-22

## 2020-04-20 MED ORDER — MISOPROSTOL 200 MCG PO TABS
ORAL_TABLET | ORAL | Status: AC
  Administered 2020-04-20: 1000 ug via RECTAL
  Filled 2020-04-20: qty 5

## 2020-04-20 MED ORDER — FENTANYL-BUPIVACAINE-NACL 0.5-0.125-0.9 MG/250ML-% EP SOLN
12.0000 mL/h | EPIDURAL | Status: DC | PRN
  Filled 2020-04-20: qty 250

## 2020-04-20 MED ORDER — MISOPROSTOL 200 MCG PO TABS: 1000.0000 ug | ORAL_TABLET | Freq: Once | ORAL | Status: AC

## 2020-04-20 MED ORDER — SENNOSIDES-DOCUSATE SODIUM 8.6-50 MG PO TABS
2.0000 | ORAL_TABLET | Freq: Every day | ORAL | Status: DC
  Administered 2020-04-21 – 2020-04-22 (×2): 2 via ORAL
  Filled 2020-04-20 (×2): qty 2

## 2020-04-20 MED ORDER — SIMETHICONE 80 MG PO CHEW: 80.0000 mg | CHEWABLE_TABLET | ORAL | Status: DC | PRN

## 2020-04-20 MED ORDER — BENZOCAINE-MENTHOL 20-0.5 % EX AERO: 1.0000 "application " | INHALATION_SPRAY | CUTANEOUS | Status: DC | PRN

## 2020-04-20 MED ORDER — FENTANYL-BUPIVACAINE-NACL 0.5-0.125-0.9 MG/250ML-% EP SOLN
EPIDURAL | Status: DC | PRN
  Administered 2020-04-20: 12 mL/h via EPIDURAL

## 2020-04-20 MED ORDER — IBUPROFEN 600 MG PO TABS
600.0000 mg | ORAL_TABLET | Freq: Four times a day (QID) | ORAL | Status: DC
  Administered 2020-04-20 – 2020-04-22 (×8): 600 mg via ORAL
  Filled 2020-04-20 (×8): qty 1

## 2020-04-20 MED ORDER — ONDANSETRON HCL 4 MG/2ML IJ SOLN: 4.0000 mg | INTRAMUSCULAR | Status: DC | PRN

## 2020-04-20 NOTE — Progress Notes (Signed)
More painful contractions, denies VB CE now 5-6/75/-1, TOCO q4, pitocin at 55mU/min BP (!) 139/96   Pulse 100   Temp 98 F (36.7 C) (Oral)   Resp 18   Ht 5\' 9"  (1.753 m)   Wt 129.7 kg   LMP  (LMP Unknown)   SpO2 98%   BMI 42.23 kg/m   Now desires epidural, will call quickly as patient tends to labor fast once active. Sporadically elevated Bps 140s/90 however patient moving in bed and in pain, denies other PreE symptoms. Will reassess after pain control.

## 2020-04-20 NOTE — Progress Notes (Signed)
Patient doing well, feeling more painful contractions BP (!) 144/70   Pulse 98   Temp 97.9 F (36.6 C) (Oral)   Resp 18   Ht 5\' 9"  (1.753 m)   Wt 129.7 kg   LMP  (LMP Unknown)   SpO2 98%   BMI 42.23 kg/m  Cat 1 tracing, TOCO q5-34m, pitocin at 20 mU/min Not planning epidural Titrate pitocin per protocol

## 2020-04-20 NOTE — Anesthesia Procedure Notes (Signed)
Epidural Patient location during procedure: OB Start time: 04/20/2020 1:35 PM End time: 04/20/2020 1:40 PM  Staffing Anesthesiologist: Bethena Midget, MD  Preanesthetic Checklist Completed: patient identified, IV checked, site marked, risks and benefits discussed, surgical consent, monitors and equipment checked, pre-op evaluation and timeout performed  Epidural Patient position: sitting Prep: DuraPrep and site prepped and draped Patient monitoring: continuous pulse ox and blood pressure Approach: midline Location: L3-L4 Injection technique: LOR air  Needle:  Needle type: Tuohy  Needle gauge: 17 G Needle length: 9 cm and 9 Needle insertion depth: 6 cm Catheter type: closed end flexible Catheter size: 19 Gauge Catheter at skin depth: 11 cm Test dose: negative  Assessment Events: blood not aspirated, injection not painful, no injection resistance, no paresthesia and negative IV test

## 2020-04-20 NOTE — Progress Notes (Signed)
Feeling more ctx Afeb, VSS FHT- 130s, mod variability, intermittent variable and late decels, Cat II, ctx q 3-5 min VE-3/70/-2, vtx  Continue pitocin, monitor progress, anticipate SVD, currently in latent labor, hopefully getting close to active

## 2020-04-20 NOTE — Anesthesia Preprocedure Evaluation (Signed)
Anesthesia Evaluation  Patient identified by MRN, date of birth, ID band Patient awake    Reviewed: Allergy & Precautions, H&P , NPO status , Patient's Chart, lab work & pertinent test results, reviewed documented beta blocker date and time   Airway Mallampati: II  TM Distance: >3 FB Neck ROM: full    Dental no notable dental hx. (+) Teeth Intact, Dental Advisory Given   Pulmonary neg pulmonary ROS,    Pulmonary exam normal breath sounds clear to auscultation       Cardiovascular negative cardio ROS Normal cardiovascular exam Rhythm:regular Rate:Normal     Neuro/Psych negative neurological ROS  negative psych ROS   GI/Hepatic negative GI ROS, Neg liver ROS,   Endo/Other  negative endocrine ROS  Renal/GU negative Renal ROS  negative genitourinary   Musculoskeletal   Abdominal (+) + obese,   Peds  Hematology negative hematology ROS (+)   Anesthesia Other Findings   Reproductive/Obstetrics (+) Pregnancy                             Anesthesia Physical Anesthesia Plan  ASA: III  Anesthesia Plan: Epidural   Post-op Pain Management:    Induction:   PONV Risk Score and Plan:   Airway Management Planned:   Additional Equipment:   Intra-op Plan:   Post-operative Plan:   Informed Consent: I have reviewed the patients History and Physical, chart, labs and discussed the procedure including the risks, benefits and alternatives for the proposed anesthesia with the patient or authorized representative who has indicated his/her understanding and acceptance.     Dental Advisory Given  Plan Discussed with:   Anesthesia Plan Comments: (Labs checked- platelets confirmed with RN in room. Fetal heart tracing, per RN, reported to be stable enough for sitting procedure. Discussed epidural, and patient consents to the procedure:  included risk of possible headache,backache, failed block,  allergic reaction, and nerve injury. This patient was asked if she had any questions or concerns before the procedure started.)        Anesthesia Quick Evaluation

## 2020-04-20 NOTE — Anesthesia Postprocedure Evaluation (Signed)
Anesthesia Post Note  Patient: Crystal Sharp  Procedure(s) Performed: AN AD HOC LABOR EPIDURAL     Patient location during evaluation: Mother Baby Anesthesia Type: Epidural Level of consciousness: awake and alert Pain management: pain level controlled Vital Signs Assessment: post-procedure vital signs reviewed and stable Respiratory status: spontaneous breathing, nonlabored ventilation and respiratory function stable Cardiovascular status: stable Postop Assessment: no headache, no backache and epidural receding Anesthetic complications: no   No complications documented.  Last Vitals:  Vitals:   04/20/20 1647 04/20/20 1702  BP: 116/64 (!) 111/44  Pulse: 86 (!) 104  Resp: 18   Temp:    SpO2:      Last Pain:  Vitals:   04/20/20 1647  TempSrc:   PainSc: 0-No pain                 Reatha Sur

## 2020-04-21 LAB — COMPREHENSIVE METABOLIC PANEL
ALT: 12 U/L (ref 0–44)
AST: 14 U/L — ABNORMAL LOW (ref 15–41)
Albumin: 2 g/dL — ABNORMAL LOW (ref 3.5–5.0)
Alkaline Phosphatase: 98 U/L (ref 38–126)
Anion gap: 5 (ref 5–15)
BUN: <5 mg/dL — ABNORMAL LOW (ref 6–20)
CO2: 23 mmol/L (ref 22–32)
Calcium: 8.1 mg/dL — ABNORMAL LOW (ref 8.9–10.3)
Chloride: 108 mmol/L (ref 98–111)
Creatinine, Ser: 0.52 mg/dL (ref 0.44–1.00)
GFR, Estimated: >60 mL/min (ref >60)
Glucose, Bld: 85 mg/dL (ref 70–99)
Potassium: 3.6 mmol/L (ref 3.5–5.1)
Sodium: 136 mmol/L (ref 135–145)
Total Bilirubin: 0.2 mg/dL — ABNORMAL LOW (ref 0.3–1.2)
Total Protein: 4.9 g/dL — ABNORMAL LOW (ref 6.5–8.1)

## 2020-04-21 LAB — CBC
HCT: 28.3 % — ABNORMAL LOW (ref 36.0–46.0)
Hemoglobin: 9.3 g/dL — ABNORMAL LOW (ref 12.0–15.0)
MCH: 29.2 pg (ref 26.0–34.0)
MCHC: 32.9 g/dL (ref 30.0–36.0)
MCV: 88.7 fL (ref 80.0–100.0)
Platelets: 186 10*3/uL (ref 150–400)
RBC: 3.19 MIL/uL — ABNORMAL LOW (ref 3.87–5.11)
RDW: 14.6 % (ref 11.5–15.5)
WBC: 10.8 10*3/uL — ABNORMAL HIGH (ref 4.0–10.5)
nRBC: 0 % (ref 0.0–0.2)

## 2020-04-21 NOTE — Progress Notes (Signed)
Blood pressures have been normotensive however baby is not maintaining temperatures well. Will plan for discharge in the AM given baby's status

## 2020-04-21 NOTE — Progress Notes (Signed)
POSTPARTUM PROGRESS NOTE  Post Partum Day #1  Subjective:  No acute events overnight.  Pt denies problems with ambulating, voiding or po intake.  She denies nausea or vomiting.  Pain is well controlled. Lochia Minimal.   Objective: Blood pressure (!) 145/94, pulse 100, temperature 97.7 F (36.5 C), temperature source Oral, resp. rate 18, height 5\' 9"  (1.753 m), weight 129.7 kg, SpO2 100 %, unknown if currently breastfeeding.  Physical Exam:  General: alert, cooperative and no distress Lochia:normal flow Chest: CTAB Heart: RRR no m/r/g Abdomen: +BS, soft, nontender Uterine Fundus: firm, 1cm below umbilicus Extremities: neg edema, neg calf TTP BL, neg Homans BL  Recent Labs    04/18/20 1712 04/21/20 0458  HGB 11.6* 9.3*  HCT 35.1* 28.3*    Assessment/Plan:  ASSESSMENT: Crystal Sharp is a 40 y.o. 24 s/p SVD @ [redacted]w[redacted]d. PNC c/b AMA, BMI >40, h/o GHTN.   H/o GHTN - overall normotensive however occ elevated diastolics. CMP this morning, will watch until afternoon at minimum and discharge if stable   LOS: 3 days

## 2020-04-22 MED ORDER — IBUPROFEN 800 MG PO TABS: 800.0000 mg | ORAL_TABLET | Freq: Three times a day (TID) | ORAL | 1 refills | Status: DC | PRN

## 2020-04-22 NOTE — Progress Notes (Addendum)
POSTPARTUM PROGRESS NOTE  Post Partum Day #2  Subjective:  No acute events overnight.  Pt denies problems with ambulating, voiding or po intake.  She denies nausea or vomiting.  Pain is well controlled. Lochia Minimal. Continuing to work on breatfeeding  Objective: Blood pressure 127/83, pulse 82, temperature 97.7 F (36.5 C), temperature source Oral, resp. rate 19, height 5\' 9"  (1.753 m), weight 129.7 kg, SpO2 100 %, unknown if currently breastfeeding.  Physical Exam:  General: alert, cooperative and no distress Lochia:normal flow Chest: CTAB Heart: RRR no m/r/g Abdomen: +BS, soft, nontender Uterine Fundus: firm, 1cm below umbilicus Extremities: neg edema, neg calf TTP BL, neg Homans BL  Recent Labs    04/21/20 0458  HGB 9.3*  HCT 28.3*    Assessment/Plan:  ASSESSMENT: Crystal Sharp is a 40 y.o. 24 s/p SVD @ [redacted]w[redacted]d. PNC c/b AMA, BMI >40, h/o GHTN.   H/o GHTN - spurious elevated BP yesterday taken while talking to family during EMS visit, has been normotensive since and asx. Routine follow up.   Discharge today, planning on vasectomy for contraception  LOS: 4 days

## 2020-04-22 NOTE — Discharge Summary (Addendum)
Postpartum Discharge Summary  Date of Service updated     Patient Name: Crystal Sharp DOB: 1980/11/28 MRN: [id number]  Date of admission: 04/18/2020 Delivery date:04/20/2020  Delivering provider: Carlisle Cater  Date of discharge: 04/22/2020  Admitting diagnosis: Pregnancy [Z34.90] Intrauterine pregnancy: [redacted]w[redacted]d     Secondary diagnosis:  Active Problems:   Pregnancy  Additional problems: none    Discharge diagnosis: Term Pregnancy Delivered                                              Post partum procedures:none Augmentation: AROM, Pitocin and Cytotec Complications: None  Hospital course: Induction of Labor With Vaginal Delivery   40 y.o. yo [phone] at [redacted]w[redacted]d was admitted to the hospital 04/18/2020 for induction of labor.  Indication for induction: questionable tracing at term in office.  Patient had an uncomplicated labor course as follows: Membrane Rupture Time/Date: 7:26 PM ,04/19/2020   Delivery Method:Vaginal, Spontaneous  Episiotomy: None  Lacerations:  None  Details of delivery can be found in separate delivery note.  Patient had a routine postpartum course. Patient is discharged home 04/22/20.  Newborn Data: Birth date:04/20/2020  Birth time:3:45 PM  Gender:Female  Living status:Living  Apgars:9 ,9  Weight:3201 g   Physical exam  Vitals:   04/21/20 1303 04/21/20 1500 04/21/20 2146 04/22/20 0544  BP: 127/84 130/87 123/78 127/83  Pulse: 77  90 82  Resp: 19  20 19   Temp: 98.2 F (36.8 C)  98.7 F (37.1 C) 97.7 F (36.5 C)  TempSrc: Oral  Oral Oral  SpO2: 100%   100%  Weight:      Height:       General: alert, cooperative and no distress Lochia: appropriate Uterine Fundus: firm Incision: N/A DVT Evaluation: No evidence of DVT seen on physical exam. Negative Homan's sign. No cords or calf tenderness. Labs: Lab Results  Component Value Date   WBC 10.8 (H) 04/21/2020   HGB 9.3 (L) 04/21/2020   HCT 28.3 (L) 04/21/2020   MCV 88.7 04/21/2020   PLT 186  04/21/2020   CMP Latest Ref Rng & Units 04/21/2020  Glucose 70 - 99 mg/dL 85  BUN 6 - 20 mg/dL 06/21/2020)  Creatinine <3(F - 1.00 mg/dL 5.73  Sodium 2.20 - 254 mmol/L 136  Potassium 3.5 - 5.1 mmol/L 3.6  Chloride 98 - 111 mmol/L 108  CO2 22 - 32 mmol/L 23  Calcium 8.9 - 10.3 mg/dL 8.1(L)  Total Protein 6.5 - 8.1 g/dL 4.9(L)  Total Bilirubin 0.3 - 1.2 mg/dL 270)  Alkaline Phos 38 - 126 U/L 98  AST 15 - 41 U/L 14(L)  ALT 0 - 44 U/L 12   Edinburgh Score: No flowsheet data found.    After visit meds:  Allergies as of 04/22/2020   No Known Allergies     Medication List    STOP taking these medications   aspirin EC 81 MG tablet     TAKE these medications   calcium carbonate 750 MG chewable tablet Commonly known as: TUMS EX Chew 1 tablet by mouth daily as needed for heartburn.   ibuprofen 800 MG tablet Commonly known as: ADVIL Take 1 tablet (800 mg total) by mouth every 8 (eight) hours as needed.   Prenatal Complete 14-0.4 MG Tabs Use as directed on package What changed:   how much to take  how to take this  when to take this        Discharge home in stable condition Infant Feeding: Breast Infant Disposition:home with mother Discharge instruction: per After Visit Summary and Postpartum booklet. Activity: Advance as tolerated. Pelvic rest for 6 weeks.  Diet: routine diet Anticipated Birth Control: vasectomy Postpartum Appointment:6 weeks     04/22/2020 Carlisle Cater, MD

## 2020-04-24 ENCOUNTER — Ambulatory Visit: Payer: Self-pay

## 2020-04-24 NOTE — Lactation Note (Signed)
This note was copied from a baby's chart. Lactation Consultation Note  Patient Name: Escarlet Saathoff [id number] Date: 04/24/2020 Reason for consult: Initial assessment;Infant weight loss;Hyperbilirubinemia;Other (Comment) (baby is not being tx at this point for a Serum Bili of 16.3, under  heat shield due to low temp. per PICU at report for tonight baby will be fed under the shield until temp stabilizes. mom at  bedside, and has pumped x 1 - see below for details. F/U 3/16) Age:40 days- baby in PICU - P 3 experienced BF. See details below.    Maternal Data Does the patient have breastfeeding experience prior to this delivery?: Yes How long did the patient breastfeed?: per mom breast fed her 1st baby 1 year and her 2nd baby 1 1/2 years, and plans to continue to breastfeed this baby.  Feeding Mother's Current Feeding Choice: Breast Milk and Formula  LATCH Score -     Lactation Tools Discussed/Used Tools: Pump;Flanges Flange Size: 27 Breast pump type: Double-Electric Breast Pump Pump Education: Setup, frequency, and cleaning;Milk Storage Reason for Pumping: per mom has pumped x 1 and when LC reviewed the setting mom mentioned she probably had the pressure to high due to discomfort. she pumped off 30 ml so the milk is coming in. LC reviewed supply and demand , and for now since the baby is not latching due to being under the warmer , pump every 2-3 hours for 15 mins , save milk and feed the EBM 1st and not to mix with the formula . Until the volumes increasing consistently, to make up the difference with formula , which mom is already doing. Pumping frequency: so far pumped x 1 and mom is aware to pump every 2-3 hours, both breast for 15 mins . Pumped volume: 30 mL  Interventions Interventions: Breast feeding basics reviewed;DEBP  Discharge    Consult Status Consult Status: Follow-up Date: 04/25/20 Follow-up type: In-patient    Matilde Sprang Jersee Winiarski 04/24/2020, 7:42 PM

## 2020-04-25 ENCOUNTER — Ambulatory Visit: Payer: Self-pay

## 2020-04-25 NOTE — Lactation Note (Signed)
This note was copied from a baby's chart. Lactation Consultation Note  Patient Name: Crystal Sharp [id number] Date: 04/25/2020 Reason for consult: Follow-up assessment;Mother's request;Hyperbilirubinemia Age:40 years LC to PICU for f/u visit. Mother states that baby woke and bf with audible swallows this afternoon. Otherwise, the baby has been bottle fed mom's milk and formula. Mom and I spent over 20 minutes trying to wake baby to bf. We finally placed sleeping baby sts. LC notified RN. LC encouraged mom to pump q3 until baby is bf well to protect her milk supply. LC team will plan f/u to observe baby bf prior to d/c. RN to call lc when baby is awake and feeding. Feeding Mother's Current Feeding Choice: Breast Milk and Formula Nipple Type: Slow - flow   Consult Status Consult Status: Follow-up Follow-up type: In-patient   Elder Negus, MA IBCLC 04/25/2020, 6:12 PM

## 2020-04-26 ENCOUNTER — Ambulatory Visit: Payer: Self-pay

## 2020-04-26 NOTE — Lactation Note (Signed)
This note was copied from a baby's chart. Lactation Consultation Note  Patient Name: Crystal Sharp [id number] Date: 04/26/2020 Reason for consult: Mother's request;Follow-up assessment;Hyperbilirubinemia;Early term 37-38.6wks (Infant has regained birth weight today birth weight was 3201gms and today's weight was 3215 gms.)   Age:40 days, ETI female infant admitted due to hyperbilirubinemia, low temps with signs of dehydration. .Infant has regained birth weight since admitted and I& O are within ranged for 6 day old infant, infant had 5 voids and 4 stools today that are starting to transition to yellow and seedy stools. LC did latch assessment, Mom latched infant on each breast for 15 minutes each, infant latched with depth, swallows observed. LC discussed breastfeeding techniques to keep infant awake for the feeding such as : gently stroking infants neck and shoulder, talking to infant, breast compressions. Mom used the DEBP for the first time since admitted she pumped total volume of 8 mls of EBM that infant took from a slow flow bottle nipple infant was paced fed. Infant was supplemented with 47 mls of Similac Advance with iron 20 kcal formula with slow flow bottle nipple. Total supplement amount after latching at the breast was 55 mls =52mls of EBM/12mls of formula. LC gave mom another booklet on breastfeeding and infant care the first few weeks of life, reviewing infant's input and output, warning signs of dehydration, infant weight gain, etc. LC discussed with RN at next feeding to weight infant prior to latch and  weight afterwards to assess infant's intake of mom's milk at the breast.  Mom's plan: 1- Breastfeed infant according to cues, 8 to 12+ times within 24 hours, STS if possible due to hx of low temps. 2- Breastfeed infant on both breast at a feeding limit feeding to 25 or 30 minutes total feeding to  reserve energy. Marland Kitchen 3- Pump every 3 hours for 15 minutes  DEBP on initial  setting with hands on pumping and afterwards supplement infant with any EBM. 4- Mom understands to start using the DEBP to help with breast stimulation and establishing mom's milk supply. 5- Infant will continue to be supplemented after latching at the breast with mom's EBM / formula after each feeding, Day 6-7 supplement intake range is 55 to 60 mls per feeding. 6- Mom understands to follow this plan  after discharge and  until infant's follow up appointment with her  Pediatrician.   Maternal Data    Feeding Mother's Current Feeding Choice: Breast Milk and Formula Nipple Type: Slow - flow  LATCH Score                    Lactation Tools Discussed/Used Tools: Pump Breast pump type: Double-Electric Breast Pump Reason for Pumping: Mom will start pumping after latching infant at the breast to help stimulate and establish mom's milk supply. Pumping frequency: Pump every 3 hours for 15 minutes on inital setting.  Interventions Interventions: Skin to skin;Breast compression;Support pillows;Adjust position;Position options;Expressed milk;DEBP;Hand express;Breast massage;Education  Discharge Pump: DEBP;Personal  Consult Status Consult Status: Follow-up Date: 04/27/20 Follow-up type: In-patient    Danelle Earthly 04/26/2020, 4:52 PM

## 2020-05-02 ENCOUNTER — Inpatient Hospital Stay (HOSPITAL_COMMUNITY): Admit: 2020-05-02 | Payer: Self-pay

## 2020-12-04 ENCOUNTER — Ambulatory Visit (INDEPENDENT_AMBULATORY_CARE_PROVIDER_SITE_OTHER): Payer: BC Managed Care – PPO | Admitting: Podiatry

## 2020-12-04 ENCOUNTER — Encounter: Payer: Self-pay | Admitting: Podiatry

## 2020-12-04 ENCOUNTER — Other Ambulatory Visit: Payer: Self-pay

## 2020-12-04 ENCOUNTER — Ambulatory Visit (INDEPENDENT_AMBULATORY_CARE_PROVIDER_SITE_OTHER): Payer: BC Managed Care – PPO

## 2020-12-04 DIAGNOSIS — E669 Obesity, unspecified: Secondary | ICD-10-CM | POA: Insufficient documentation

## 2020-12-04 DIAGNOSIS — M60272 Foreign body granuloma of soft tissue, not elsewhere classified, left ankle and foot: Secondary | ICD-10-CM

## 2020-12-04 DIAGNOSIS — L600 Ingrowing nail: Secondary | ICD-10-CM

## 2020-12-04 DIAGNOSIS — O210 Mild hyperemesis gravidarum: Secondary | ICD-10-CM | POA: Insufficient documentation

## 2020-12-04 DIAGNOSIS — R52 Pain, unspecified: Secondary | ICD-10-CM | POA: Diagnosis not present

## 2020-12-04 DIAGNOSIS — S99922A Unspecified injury of left foot, initial encounter: Secondary | ICD-10-CM | POA: Diagnosis not present

## 2020-12-04 DIAGNOSIS — Q828 Other specified congenital malformations of skin: Secondary | ICD-10-CM | POA: Diagnosis not present

## 2020-12-04 NOTE — Patient Instructions (Signed)
Look for urea 40% gel for the nail and salicylic acid 17% cream or ointment and apply to the thickened dry skin / calluses. This can be bought over the counter, at a pharmacy or online such as [employer].

## 2020-12-10 NOTE — Progress Notes (Signed)
  Subjective:  Patient ID: Crystal Sharp, female    DOB: 11-24-1980,  MRN: [id number]  Chief Complaint  Patient presents with   Foot Injury    NP  left foot injury    40 y.o. female presents with the above complaint. History confirmed with patient.  Think she may have stepped on something as a foreign body.  Also has soreness in the left great toenail  Objective:  Physical Exam: warm, good capillary refill, no trophic changes or ulcerative lesions, normal DP and PT pulses, and normal sensory exam. Left Foot: Porokeratosis midfoot, painful hallux nail   Radiographs: Multiple views x-ray of the left foot: No foreign body detected Assessment:   1. Foreign body granuloma of soft tissue of left foot   2. Ingrowing left great toenail      Plan:  Patient was evaluated and treated and all questions answered.  The area that is painful for her has if it was a foreign body puncture wound appears to now be a porokeratosis.  all symptomatic hyperkeratoses were safely debrided with a sterile #15 blade to patient's level of comfort without incident. We discussed preventative and palliative care of these lesions including supportive and accommodative shoegear, padding, prefabricated and custom molded accommodative orthoses, use of a pumice stone and lotions/creams daily.   No follow-ups on file.

## 2021-09-02 DIAGNOSIS — J209 Acute bronchitis, unspecified: Secondary | ICD-10-CM | POA: Diagnosis not present

## 2023-03-30 ENCOUNTER — Other Ambulatory Visit: Payer: Self-pay

## 2023-03-30 ENCOUNTER — Emergency Department (HOSPITAL_BASED_OUTPATIENT_CLINIC_OR_DEPARTMENT_OTHER): Payer: No Typology Code available for payment source | Admitting: Radiology

## 2023-03-30 ENCOUNTER — Encounter (HOSPITAL_BASED_OUTPATIENT_CLINIC_OR_DEPARTMENT_OTHER): Payer: Self-pay | Admitting: *Deleted

## 2023-03-30 ENCOUNTER — Emergency Department (HOSPITAL_BASED_OUTPATIENT_CLINIC_OR_DEPARTMENT_OTHER)
Admission: EM | Admit: 2023-03-30 | Discharge: 2023-03-30 | Disposition: A | Discharge status: Home / Self Care | Payer: No Typology Code available for payment source | Attending: Emergency Medicine | Admitting: Emergency Medicine

## 2023-03-30 DIAGNOSIS — Y9301 Activity, walking, marching and hiking: Secondary | ICD-10-CM | POA: Insufficient documentation

## 2023-03-30 DIAGNOSIS — S86112A Strain of other muscle(s) and tendon(s) of posterior muscle group at lower leg level, left leg, initial encounter: Secondary | ICD-10-CM | POA: Insufficient documentation

## 2023-03-30 DIAGNOSIS — S8992XA Unspecified injury of left lower leg, initial encounter: Secondary | ICD-10-CM | POA: Diagnosis present

## 2023-03-30 DIAGNOSIS — W010XXA Fall on same level from slipping, tripping and stumbling without subsequent striking against object, initial encounter: Secondary | ICD-10-CM | POA: Diagnosis not present

## 2023-03-30 DIAGNOSIS — S86812A Strain of other muscle(s) and tendon(s) at lower leg level, left leg, initial encounter: Secondary | ICD-10-CM

## 2023-03-30 MED ORDER — IBUPROFEN 800 MG PO TABS: 800.0000 mg | ORAL_TABLET | Freq: Three times a day (TID) | ORAL | 0 refills | Status: DC

## 2023-03-30 MED ORDER — CYCLOBENZAPRINE HCL 10 MG PO TABS
10.0000 mg | ORAL_TABLET | Freq: Once | ORAL | Status: AC
  Administered 2023-03-30: 10 mg via ORAL
  Filled 2023-03-30: qty 1

## 2023-03-30 MED ORDER — CYCLOBENZAPRINE HCL 10 MG PO TABS: 10.0000 mg | ORAL_TABLET | Freq: Two times a day (BID) | ORAL | 0 refills | Status: DC | PRN

## 2023-03-30 MED ORDER — IBUPROFEN 800 MG PO TABS
800.0000 mg | ORAL_TABLET | Freq: Once | ORAL | Status: AC
  Administered 2023-03-30: 800 mg via ORAL
  Filled 2023-03-30: qty 1

## 2023-03-30 NOTE — ED Provider Notes (Signed)
[place] EMERGENCY DEPARTMENT AT [facility] Provider Note   CSN: [id number] Arrival date & time: 03/30/23  1431     History  Chief Complaint  Patient presents with   Leg Pain    Crystal Sharp is a 43 y.o. female.  Patient is a healthy 43 year old female presenting today with ongoing left calf pain.  It started after a fall when she was walking out of work.  She reports slipping on the ice falling backwards.  Initially her left upper calf was just sore she was able to walk and go home however she went to bed and when she woke up the pain was severe.  It is painful to flex and extend her foot and anytime she tries to walk on it.  She denies any other injury from this fall.  The history is provided by the patient.  Leg Pain      Home Medications Prior to Admission medications   Medication Sig Start Date End Date Taking? Authorizing Provider  cyclobenzaprine (FLEXERIL) 10 MG tablet Take 1 tablet (10 mg total) by mouth 2 (two) times daily as needed for muscle spasms. 03/30/23  Yes Gwyneth Sprout, MD  ibuprofen (ADVIL) 800 MG tablet Take 1 tablet (800 mg total) by mouth 3 (three) times daily. 03/30/23  Yes Gwyneth Sprout, MD  calcium carbonate (TUMS EX) 750 MG chewable tablet Chew 1 tablet by mouth daily as needed for heartburn.    [provider]  NIFEdipine (PROCARDIA-XL/NIFEDICAL-XL) 30 MG 24 hr tablet nifedipine ER 30 mg tablet,extended release 24 hr  TAKE 2 TABLETS BY MOUTH EVERY DAY    [provider]  Prenatal Vit-Fe Fumarate-FA (PRENATAL COMPLETE) 14-0.4 MG TABS Use as directed on package Patient taking differently: Take 1 tablet by mouth daily. Use as directed on package 10/22/12   Graylon Good, PA-C      Allergies    Patient has no known allergies.    Review of Systems   Review of Systems  Physical Exam Updated Vital Signs BP (!) 154/92   Pulse (!) 101   Resp 17   LMP 02/27/2023   SpO2 99%  Physical Exam Vitals and nursing  note reviewed.  Cardiovascular:     Pulses: Normal pulses.  Musculoskeletal:        General: Normal range of motion.     Left knee: Normal.     Left lower leg: Tenderness present.     Left ankle: Normal.       Legs:     Comments: Pain along the left lateral upper calf.  Patient is able to flex and extend the left foot without difficulty.  Achilles tendon is intact.  Normal flexion and extension of the knee.  Skin:    General: Skin is warm.  Neurological:     Mental Status: She is alert.     ED Results / Procedures / Treatments   Labs (all labs ordered are listed, but only abnormal results are displayed) Labs Reviewed - No data to display  EKG None  Radiology DG Ankle Complete Left Result Date: 03/30/2023 CLINICAL DATA:  Fall, pain EXAM: LEFT ANKLE COMPLETE - 3+ VIEW COMPARISON:  None Available. FINDINGS: There is no evidence of fracture, dislocation, or joint effusion. There is no evidence of arthropathy or other focal bone abnormality. Soft tissues are unremarkable. IMPRESSION: Negative. Electronically Signed   By: Charlett Nose M.D.   On: 03/30/2023 18:30    Procedures Procedures    Medications Ordered in  ED Medications  cyclobenzaprine (FLEXERIL) tablet 10 mg (has no administration in time range)  ibuprofen (ADVIL) tablet 800 mg (has no administration in time range)    ED Course/ Medical Decision Making/ A&P                                 Medical Decision Making Amount and/or Complexity of Data Reviewed Radiology: ordered and independent interpretation performed. Decision-making details documented in ED Course.  Risk Prescription drug management.   Patient presenting today with injury to the calf from a fall.  Most consistent with calf strain.  No evidence of tendon rupture with normal range of motion at the knee and the ankle. I have independently visualized and interpreted pt's images today.  Ankle imaging is negative for acute findings.  At this time we will  treat for calf strain.         Final Clinical Impression(s) / ED Diagnoses Final diagnoses:  Strain of calf muscle, left, initial encounter    Rx / DC Orders ED Discharge Orders          Ordered    cyclobenzaprine (FLEXERIL) 10 MG tablet  2 times daily PRN        03/30/23 1915    ibuprofen (ADVIL) 800 MG tablet  3 times daily        03/30/23 1915              Gwyneth Sprout, MD 03/30/23 1916

## 2023-03-30 NOTE — Discharge Instructions (Addendum)
Make sure you are flexing and pointing your toe regularly does stretch that calf so it does not get tight.  Use the crutches as needed.  You can return to work tomorrow but you may have to modify your activity based on how you feel.

## 2023-03-30 NOTE — ED Triage Notes (Signed)
Left calf pain since she slipped and fell on black ice this am.  Pt is tearful during triage.  No knee pain.  Left ankle pain.

## 2023-05-15 ENCOUNTER — Ambulatory Visit: Payer: BC Managed Care – PPO | Admitting: Family Medicine

## 2023-05-15 ENCOUNTER — Encounter: Payer: Self-pay | Admitting: Family Medicine

## 2023-05-15 VITALS — BP 132/84 | HR 85 | Temp 98.2°F | Ht 69.0 in | Wt 286.0 lb

## 2023-05-15 DIAGNOSIS — E66813 Obesity, class 3: Secondary | ICD-10-CM

## 2023-05-15 DIAGNOSIS — Z0000 Encounter for general adult medical examination without abnormal findings: Secondary | ICD-10-CM

## 2023-05-15 DIAGNOSIS — Z6841 Body Mass Index (BMI) 40.0 and over, adult: Secondary | ICD-10-CM | POA: Diagnosis not present

## 2023-05-15 DIAGNOSIS — Z7689 Persons encountering health services in other specified circumstances: Secondary | ICD-10-CM

## 2023-05-15 LAB — COMPREHENSIVE METABOLIC PANEL WITH GFR
ALT: 34 U/L (ref 0–35)
AST: 42 U/L — ABNORMAL HIGH (ref 0–37)
Albumin: 4.4 g/dL (ref 3.5–5.2)
Alkaline Phosphatase: 92 U/L (ref 39–117)
BUN: 14 mg/dL (ref 6–23)
CO2: 25 meq/L (ref 19–32)
Calcium: 9.2 mg/dL (ref 8.4–10.5)
Chloride: 104 meq/L (ref 96–112)
Creatinine, Ser: 0.79 mg/dL (ref 0.40–1.20)
GFR: 91.9 mL/min (ref >60.00)
Glucose, Bld: 97 mg/dL (ref 70–99)
Potassium: 3.9 meq/L (ref 3.5–5.1)
Sodium: 138 meq/L (ref 135–145)
Total Bilirubin: 0.2 mg/dL (ref 0.2–1.2)
Total Protein: 7.5 g/dL (ref 6.0–8.3)

## 2023-05-15 LAB — CBC WITH DIFFERENTIAL/PLATELET
Basophils Absolute: 0 10*3/uL (ref 0.0–0.1)
Basophils Relative: 0.3 % (ref 0.0–3.0)
Eosinophils Absolute: 0 10*3/uL (ref 0.0–0.7)
Eosinophils Relative: 0.5 % (ref 0.0–5.0)
HCT: 39.7 % (ref 36.0–46.0)
Hemoglobin: 13 g/dL (ref 12.0–15.0)
Lymphocytes Relative: 32.3 % (ref 12.0–46.0)
Lymphs Abs: 2.6 10*3/uL (ref 0.7–4.0)
MCHC: 32.7 g/dL (ref 30.0–36.0)
MCV: 88.6 fl (ref 78.0–100.0)
Monocytes Absolute: 0.5 10*3/uL (ref 0.1–1.0)
Monocytes Relative: 6.1 % (ref 3.0–12.0)
Neutro Abs: 4.9 10*3/uL (ref 1.4–7.7)
Neutrophils Relative %: 60.8 % (ref 43.0–77.0)
Platelets: 283 10*3/uL (ref 150.0–400.0)
RBC: 4.48 Mil/uL (ref 3.87–5.11)
RDW: 13.2 % (ref 11.5–15.5)
WBC: 8.1 10*3/uL (ref 4.0–10.5)

## 2023-05-15 LAB — LIPID PANEL
Cholesterol: 180 mg/dL (ref 0–200)
HDL: 40.7 mg/dL (ref >39.00)
LDL Cholesterol: 118 mg/dL — ABNORMAL HIGH (ref 0–99)
NonHDL: 139.2
Total CHOL/HDL Ratio: 4
Triglycerides: 104 mg/dL (ref 0.0–149.0)
VLDL: 20.8 mg/dL (ref 0.0–40.0)

## 2023-05-15 LAB — HEMOGLOBIN A1C: Hgb A1c MFr Bld: 5.4 % (ref 4.6–6.5)

## 2023-05-15 LAB — TSH: TSH: 2.84 u[IU]/mL (ref 0.35–5.50)

## 2023-05-15 NOTE — Progress Notes (Signed)
 New Patient Office Visit  Subjective   Patient ID: Crystal Sharp, female    DOB: Nov 20, 1980  Age: 43 y.o. MRN: [id number]  CC:  Chief Complaint  Patient presents with   Establish Care    HPI Crystal Sharp presents to establish care with new provider. She has no concerns.   Patients previous primary care provider: None   Specialist: Wynonia Hazard, MD  Outpatient Encounter Medications as of 05/15/2023  Medication Sig   [DISCONTINUED] calcium carbonate (TUMS EX) 750 MG chewable tablet Chew 1 tablet by mouth daily as needed for heartburn.   [DISCONTINUED] cyclobenzaprine (FLEXERIL) 10 MG tablet Take 1 tablet (10 mg total) by mouth 2 (two) times daily as needed for muscle spasms.   [DISCONTINUED] ibuprofen (ADVIL) 800 MG tablet Take 1 tablet (800 mg total) by mouth 3 (three) times daily.   [DISCONTINUED] NIFEdipine (PROCARDIA-XL/NIFEDICAL-XL) 30 MG 24 hr tablet nifedipine ER 30 mg tablet,extended release 24 hr  TAKE 2 TABLETS BY MOUTH EVERY DAY   [DISCONTINUED] Prenatal Vit-Fe Fumarate-FA (PRENATAL COMPLETE) 14-0.4 MG TABS Use as directed on package (Patient taking differently: Take 1 tablet by mouth daily. Use as directed on package)   No facility-administered encounter medications on file as of 05/15/2023.    History reviewed. No pertinent past medical history.  Past Surgical History:  Procedure Laterality Date   WISDOM TOOTH EXTRACTION      Family History  Problem Relation Age of Onset   Cancer Mother    Hypertension Father    Heart disease Sister    Diabetes type I Daughter     Social History   Socioeconomic History   Marital status: Married    Spouse name: Not on file   Number of children: 3   Years of education: Not on file   Highest education level: Some college, no degree  Occupational History   Not on file  Tobacco Use   Smoking status: Never   Smokeless tobacco: Never  Vaping Use   Vaping status: Never Used  Substance and Sexual  Activity   Alcohol use: Yes    Comment: Once a month   Drug use: No   Sexual activity: Yes    Birth control/protection: None  Other Topics Concern   Not on file  Social History Narrative   Not on file   Social Drivers of Health   Financial Resource Strain: Low Risk  (05/15/2023)   Overall Financial Resource Strain (CARDIA)    Difficulty of Paying Living Expenses: Not very hard  Food Insecurity: Food Insecurity Present (05/15/2023)   Hunger Vital Sign    Worried About Running Out of Food in the Last Year: Sometimes true    Ran Out of Food in the Last Year: Never true  Transportation Needs: No Transportation Needs (05/15/2023)   PRAPARE - Administrator, Civil Service (Medical): No    Lack of Transportation (Non-Medical): No  Physical Activity: Insufficiently Active (05/15/2023)   Exercise Vital Sign    Days of Exercise per Week: 3 days    Minutes of Exercise per Session: 30 min  Stress: No Stress Concern Present (05/15/2023)   [employer] of Occupational Health - Occupational Stress Questionnaire    Feeling of Stress : Only a little  Social Connections: Moderately Isolated (05/15/2023)   Social Connection and Isolation Panel [NHANES]    Frequency of Communication with Friends and Family: More than three times a week    Frequency of Social Gatherings with Friends  and Family: Once a week    Attends Religious Services: Never    Active Member of Clubs or Organizations: No    Attends Engineer, structural: Not on file    Marital Status: Married  Catering manager Violence: Not At Risk (05/15/2023)   Humiliation, Afraid, Rape, and Kick questionnaire    Fear of Current or Ex-Partner: No    Emotionally Abused: No    Physically Abused: No    Sexually Abused: No    ROS See HPI above    Objective  BP 132/84   Pulse 85   Temp 98.2 F (36.8 C) (Oral)   Ht 5\' 9"  (1.753 m)   Wt 286 lb (129.7 kg)   SpO2 98%   BMI 42.23 kg/m   Physical Exam Vitals reviewed.   Constitutional:      General: She is not in acute distress.    Appearance: Normal appearance. She is obese. She is not ill-appearing or toxic-appearing.  HENT:     Head: Normocephalic and atraumatic.     Right Ear: Tympanic membrane, ear canal and external ear normal. There is no impacted cerumen.     Left Ear: Tympanic membrane, ear canal and external ear normal. There is no impacted cerumen.     Nose:     Right Sinus: No maxillary sinus tenderness or frontal sinus tenderness.     Left Sinus: No maxillary sinus tenderness or frontal sinus tenderness.     Mouth/Throat:     Mouth: Mucous membranes are moist.     Pharynx: Oropharynx is clear. Uvula midline. No pharyngeal swelling, oropharyngeal exudate, posterior oropharyngeal erythema, uvula swelling or postnasal drip.  Eyes:     General:        Right eye: No discharge.        Left eye: No discharge.     Conjunctiva/sclera: Conjunctivae normal.     Pupils: Pupils are equal, round, and reactive to light.  Neck:     Thyroid: No thyromegaly.  Cardiovascular:     Rate and Rhythm: Normal rate and regular rhythm.     Pulses:          Posterior tibial pulses are 2+ on the right side and 2+ on the left side.     Heart sounds: Normal heart sounds. No murmur heard.    No friction rub. No gallop.  Pulmonary:     Effort: Pulmonary effort is normal. No respiratory distress.     Breath sounds: Normal breath sounds.  Abdominal:     General: Abdomen is flat. Bowel sounds are normal. There is no distension.     Palpations: Abdomen is soft.     Tenderness: There is no abdominal tenderness.  Musculoskeletal:        General: Normal range of motion.     Cervical back: Normal range of motion.     Right lower leg: No edema.     Left lower leg: No edema.  Lymphadenopathy:     Cervical: No cervical adenopathy.  Skin:    General: Skin is warm and dry.  Neurological:     General: No focal deficit present.     Mental Status: She is alert and  oriented to person, place, and time. Mental status is at baseline.     Motor: No weakness.     Gait: Gait normal.  Psychiatric:        Mood and Affect: Mood normal.        Behavior: Behavior normal.  Thought Content: Thought content normal.        Judgment: Judgment normal.      Assessment & Plan:  Annual physical exam  Class 3 severe obesity due to excess calories without serious comorbidity with body mass index (BMI) of 40.0 to 44.9 in adult (HCC) -     CBC with Differential/Platelet -     Comprehensive metabolic panel with GFR -     Hemoglobin A1c -     Lipid panel -     TSH  Encounter to establish care   1.Review health maintenance:  -Cervical cancer screening: Request records from previous GYN,  -Covid booster: Declines  -Hep C: Request records from previous GYN  2.Physical exam completed. No concerns.  3.Recommend to make an appointment with GYN for women care.  4.Recommend a healthy diet and participate in regular exercise. 5.Ordered labs (CBC, CMP, A1c, TSH and Lipids) based on BMI-obesity.   Return in about 1 year (around 05/14/2024) for physical.   Zandra Abts, NP

## 2023-05-15 NOTE — Patient Instructions (Signed)
 It was nice to meet you and look forward to taking care of you.  -Physical exam completed. No concerns.  -Recommend to make an appointment with GYN for women care.  -Recommend a healthy diet and participate in regular exercise. -Ordered labs. Office will call with lab results and you will see yours on MyChart.  -Follow up in 1 year for a physical.

## 2023-05-31 ENCOUNTER — Encounter (HOSPITAL_COMMUNITY): Payer: Self-pay | Admitting: Emergency Medicine

## 2023-05-31 ENCOUNTER — Ambulatory Visit (HOSPITAL_COMMUNITY)
Admission: EM | Admit: 2023-05-31 | Discharge: 2023-05-31 | Disposition: A | Discharge status: Home / Self Care | Attending: Emergency Medicine | Admitting: Emergency Medicine

## 2023-05-31 DIAGNOSIS — L02412 Cutaneous abscess of left axilla: Secondary | ICD-10-CM

## 2023-05-31 MED ORDER — DOXYCYCLINE HYCLATE 100 MG PO CAPS: 100.0000 mg | ORAL_CAPSULE | Freq: Two times a day (BID) | ORAL | 0 refills | Status: AC

## 2023-05-31 NOTE — ED Provider Notes (Signed)
 [facility]    CSN: [id number] Arrival date & time: 05/31/23  1202      History   Chief Complaint Chief Complaint  Patient presents with   Abscess    HPI Crystal Sharp is a 43 y.o. female. Pt reports has cluster of boils in left axilla area. Reports her aunt popped it a week ago and it got much better but now is getting worse again. Boils are painful and swollen. Has had similar sx before - they often come and go witout treatment. She has been applying peroxide and warm compresses without relief   Abscess   History reviewed. No pertinent past medical history.  Patient Active Problem List   Diagnosis Date Noted   Morning sickness 12/04/2020   Obesity 12/04/2020   Pregnancy 04/18/2020   Normal labor 04/28/2015   NSVD (normal spontaneous vaginal delivery) 04/28/2015   SVD (spontaneous vaginal delivery) 06/25/2013    Past Surgical History:  Procedure Laterality Date   WISDOM TOOTH EXTRACTION      OB History     Gravida  4   Para  3   Term  3   Preterm      AB  1   Living  3      SAB      IAB  1   Ectopic      Multiple  0   Live Births  3            Home Medications    Prior to Admission medications   Medication Sig Start Date End Date Taking? Authorizing Provider  doxycycline  (VIBRAMYCIN ) 100 MG capsule Take 1 capsule (100 mg total) by mouth 2 (two) times daily. 05/31/23  Yes Blinda Burger, NP    Family History Family History  Problem Relation Age of Onset   Cancer Mother    Hypertension Father    Heart disease Sister    Diabetes type I Daughter     Social History Social History   Tobacco Use   Smoking status: Never   Smokeless tobacco: Never  Vaping Use   Vaping status: Never Used  Substance Use Topics   Alcohol use: Yes    Comment: Once a month   Drug use: No     Allergies   Patient has no known allergies.   Review of Systems Review of Systems   Physical Exam Triage Vital Signs ED Triage  Vitals [05/31/23 1234]  Encounter Vitals Group     BP (!) 157/95     Systolic BP Percentile      Diastolic BP Percentile      Pulse Rate 81     Resp 18     Temp 99.3 F (37.4 C)     Temp Source Oral     SpO2 96 %     Weight      Height      Head Circumference      Peak Flow      Pain Score 1     Pain Loc      Pain Education      Exclude from Growth Chart    No data found.  Updated Vital Signs BP (!) 157/95 (BP Location: Right Arm)   Pulse 81   Temp 99.3 F (37.4 C) (Oral)   Resp 18   LMP 05/05/2023 (Approximate)   SpO2 96%   Visual Acuity Right Eye Distance:   Left Eye Distance:   Bilateral Distance:    Right  Eye Near:   Left Eye Near:    Bilateral Near:     Physical Exam Constitutional:      General: She is not in acute distress.    Appearance: Normal appearance. She is obese. She is not ill-appearing.  Pulmonary:     Effort: Pulmonary effort is normal.  Skin:    General: Skin is warm and dry.     Findings: Abscess present.     Comments: Larger abscess L axilla in superior aspect of axilla is narrow and spans the axilla from front to back. Also multiple superficial abscess L axilla - most are about the size of a dime or smaller and are all firm to palpation. None with fluctuance.   Neurological:     Mental Status: She is alert.      UC Treatments / Results  Labs (all labs ordered are listed, but only abnormal results are displayed) Labs Reviewed - No data to display  EKG   Radiology No results found.  Procedures Procedures (including critical care time)  Medications Ordered in UC Medications - No data to display  Initial Impression / Assessment and Plan / UC Course  I have reviewed the triage vital signs and the nursing notes.  Pertinent labs & imaging results that were available during my care of the patient were reviewed by me and considered in my medical decision making (see chart for details).     Given pt report of recurent similar  abscesses, concern for hidradenitis suppurativa. She has not discussed these sx with her pcp. I do not palpate any abscess that is fluctuant that would benefit from I&D  Final Clinical Impressions(s) / UC Diagnoses   Final diagnoses:  Abscess of left axilla     Discharge Instructions      Talk with your primary care provider about these recurring abscesses. I suspect you may have something called hidradenitis suppurativa and there are medicines that can help prevent the abscesses in this case.   Continue warm compresses/applying warm, moist heat.      ED Prescriptions     Medication Sig Dispense Auth. Provider   doxycycline  (VIBRAMYCIN ) 100 MG capsule Take 1 capsule (100 mg total) by mouth 2 (two) times daily. 20 capsule Blinda Burger, NP      PDMP not reviewed this encounter.   Blinda Burger, NP 05/31/23 1313

## 2023-05-31 NOTE — Discharge Instructions (Addendum)
 Talk with your primary care provider about these recurring abscesses. I suspect you may have something called hidradenitis suppurativa and there are medicines that can help prevent the abscesses in this case.   Continue warm compresses/applying warm, moist heat.

## 2023-05-31 NOTE — ED Triage Notes (Signed)
 Pt reports has cluster of boils in left axilla area. Reports aesthetician popped on and now created more. Pain and swelling does get worse.

## 2023-07-14 ENCOUNTER — Encounter: Payer: Self-pay | Admitting: Family Medicine

## 2023-07-14 ENCOUNTER — Ambulatory Visit (INDEPENDENT_AMBULATORY_CARE_PROVIDER_SITE_OTHER): Admitting: Family Medicine

## 2023-07-14 VITALS — BP 132/84 | HR 75 | Temp 98.3°F | Ht 69.0 in | Wt 287.0 lb

## 2023-07-14 DIAGNOSIS — Z0000 Encounter for general adult medical examination without abnormal findings: Secondary | ICD-10-CM

## 2023-07-14 NOTE — Progress Notes (Signed)
 Patient was not seen for a visit. Initially scheduled for a physical, but she just had a physical on 05/15/2023.

## 2023-09-09 ENCOUNTER — Other Ambulatory Visit: Payer: Self-pay | Admitting: Family Medicine

## 2023-09-09 NOTE — Telephone Encounter (Unsigned)
 Copied from CRM [id number]. Topic: Clinical - Medication Refill >> Sep 09, 2023  3:39 PM Franky GRADE wrote: Medication: doxycycline  (VIBRAMYCIN ) 100 MG capsule [[id number]]  Has the patient contacted their pharmacy? No (Agent: If no, request that the patient contact the pharmacy for the refill. If patient does not wish to contact the pharmacy document the reason why and proceed with request.) (Agent: If yes, when and what did the pharmacy advise?)  This is the patient's preferred pharmacy:  [id number] [address], [place] - [address] AT [facility] [address] & [facility] [address] [place] [address] Phone: [phone] Fax: [phone]   Is this the correct pharmacy for this prescription? Yes If no, delete pharmacy and type the correct one.   Has the prescription been filled recently? No  Is the patient out of the medication? Yes  Has the patient been seen for an appointment in the last year OR does the patient have an upcoming appointment? Yes  Can we respond through MyChart? Yes  Agent: Please be advised that Rx refills may take up to 3 business days. We ask that you follow-up with your pharmacy.
